# Patient Record
Sex: Female | Born: 1970
Health system: Southern US, Community
[De-identification: ages and names within clinical notes are randomized; demographics above are authoritative.]

## PROBLEM LIST (undated history)

## (undated) DIAGNOSIS — J302 Other seasonal allergic rhinitis: Secondary | ICD-10-CM

## (undated) DIAGNOSIS — J45909 Unspecified asthma, uncomplicated: Secondary | ICD-10-CM

## (undated) DIAGNOSIS — F419 Anxiety disorder, unspecified: Secondary | ICD-10-CM

## (undated) DIAGNOSIS — J3501 Chronic tonsillitis: Secondary | ICD-10-CM

## (undated) DIAGNOSIS — F32A Depression, unspecified: Secondary | ICD-10-CM

## (undated) DIAGNOSIS — T7840XA Allergy, unspecified, initial encounter: Secondary | ICD-10-CM

## (undated) DIAGNOSIS — F329 Major depressive disorder, single episode, unspecified: Secondary | ICD-10-CM

## (undated) HISTORY — PX: WISDOM TOOTH EXTRACTION: SHX21

## (undated) HISTORY — DX: Unspecified asthma, uncomplicated: J45.909

## (undated) HISTORY — PX: EYE SURGERY: SHX253

## (undated) HISTORY — PX: ENDOMETRIAL ABLATION: SHX621

## (undated) HISTORY — DX: Allergy, unspecified, initial encounter: T78.40XA

---

## 2014-07-21 ENCOUNTER — Ambulatory Visit: Payer: Self-pay

## 2015-02-01 ENCOUNTER — Ambulatory Visit: Payer: Self-pay | Admitting: Family Medicine

## 2016-08-14 ENCOUNTER — Encounter (HOSPITAL_BASED_OUTPATIENT_CLINIC_OR_DEPARTMENT_OTHER): Payer: Self-pay | Admitting: *Deleted

## 2016-08-14 NOTE — Anesthesia Preprocedure Evaluation (Addendum)
Anesthesia Evaluation  Patient identified by MRN, date of birth, ID band Patient awake    Reviewed: Allergy & Precautions, NPO status , Patient's Chart, lab work & pertinent test results  History of Anesthesia Complications (+) history of anesthetic complications  Airway Mallampati: II  TM Distance: >3 FB Neck ROM: Full    Dental no notable dental hx. (+) Teeth Intact, Dental Advisory Given   Pulmonary neg pulmonary ROS,    Pulmonary exam normal breath sounds clear to auscultation       Cardiovascular Exercise Tolerance: Good negative cardio ROS Normal cardiovascular exam Rhythm:Regular Rate:Normal     Neuro/Psych PSYCHIATRIC DISORDERS Anxiety Depression negative neurological ROS     GI/Hepatic negative GI ROS, Neg liver ROS,   Endo/Other  negative endocrine ROS  Renal/GU negative Renal ROS     Musculoskeletal negative musculoskeletal ROS (+)   Abdominal   Peds  Hematology negative hematology ROS (+)   Anesthesia Other Findings   Reproductive/Obstetrics negative OB ROS                          Anesthesia Physical Anesthesia Plan  ASA: II  Anesthesia Plan: General   Post-op Pain Management:    Induction: Intravenous  Airway Management Planned: Oral ETT  Additional Equipment:   Intra-op Plan:   Post-operative Plan: Extubation in OR  Informed Consent: I have reviewed the patients History and Physical, chart, labs and discussed the procedure including the risks, benefits and alternatives for the proposed anesthesia with the patient or authorized representative who has indicated his/her understanding and acceptance.   Dental advisory given  Plan Discussed with: CRNA and Anesthesiologist  Anesthesia Plan Comments:        Anesthesia Quick Evaluation

## 2016-08-15 ENCOUNTER — Ambulatory Visit (HOSPITAL_BASED_OUTPATIENT_CLINIC_OR_DEPARTMENT_OTHER)
Admission: RE | Admit: 2016-08-15 | Discharge: 2016-08-15 | Disposition: A | Discharge status: Home / Self Care | Payer: 59 | Source: Ambulatory Visit | Attending: Otolaryngology | Admitting: Otolaryngology

## 2016-08-15 ENCOUNTER — Ambulatory Visit (HOSPITAL_BASED_OUTPATIENT_CLINIC_OR_DEPARTMENT_OTHER): Payer: 59 | Admitting: Anesthesiology

## 2016-08-15 ENCOUNTER — Encounter (HOSPITAL_BASED_OUTPATIENT_CLINIC_OR_DEPARTMENT_OTHER)
Admission: RE | Disposition: A | Discharge status: Home / Self Care | Payer: Self-pay | Source: Ambulatory Visit | Attending: Otolaryngology

## 2016-08-15 ENCOUNTER — Encounter (HOSPITAL_BASED_OUTPATIENT_CLINIC_OR_DEPARTMENT_OTHER): Payer: Self-pay | Admitting: *Deleted

## 2016-08-15 DIAGNOSIS — F419 Anxiety disorder, unspecified: Secondary | ICD-10-CM | POA: Insufficient documentation

## 2016-08-15 DIAGNOSIS — J3501 Chronic tonsillitis: Secondary | ICD-10-CM | POA: Insufficient documentation

## 2016-08-15 DIAGNOSIS — F329 Major depressive disorder, single episode, unspecified: Secondary | ICD-10-CM | POA: Insufficient documentation

## 2016-08-15 HISTORY — DX: Chronic tonsillitis: J35.01

## 2016-08-15 HISTORY — DX: Major depressive disorder, single episode, unspecified: F32.9

## 2016-08-15 HISTORY — DX: Depression, unspecified: F32.A

## 2016-08-15 HISTORY — DX: Other seasonal allergic rhinitis: J30.2

## 2016-08-15 HISTORY — DX: Anxiety disorder, unspecified: F41.9

## 2016-08-15 HISTORY — PX: TONSILLECTOMY: SHX5217

## 2016-08-15 SURGERY — TONSILLECTOMY
Anesthesia: General | Site: Throat | Laterality: Bilateral

## 2016-08-15 MED ORDER — FENTANYL CITRATE (PF) 100 MCG/2ML IJ SOLN: 50.0000 ug | INTRAMUSCULAR | Status: DC | PRN

## 2016-08-15 MED ORDER — HYDROMORPHONE HCL 1 MG/ML IJ SOLN: 0.2500 mg | INTRAMUSCULAR | Status: DC | PRN

## 2016-08-15 MED ORDER — FENTANYL CITRATE (PF) 100 MCG/2ML IJ SOLN
INTRAMUSCULAR | Status: AC
  Filled 2016-08-15: qty 2

## 2016-08-15 MED ORDER — ONDANSETRON HCL 4 MG/2ML IJ SOLN
INTRAMUSCULAR | Status: DC | PRN
  Administered 2016-08-15: 4 mg via INTRAVENOUS

## 2016-08-15 MED ORDER — ONDANSETRON HCL 4 MG/2ML IJ SOLN
INTRAMUSCULAR | Status: AC
  Filled 2016-08-15: qty 2

## 2016-08-15 MED ORDER — HYDROMORPHONE HCL 1 MG/ML IJ SOLN
0.2500 mg | INTRAMUSCULAR | Status: DC | PRN
  Administered 2016-08-15: 0.5 mg via INTRAVENOUS

## 2016-08-15 MED ORDER — PROPOFOL 10 MG/ML IV BOLUS
INTRAVENOUS | Status: AC
  Filled 2016-08-15: qty 40

## 2016-08-15 MED ORDER — FENTANYL CITRATE (PF) 100 MCG/2ML IJ SOLN
INTRAMUSCULAR | Status: DC | PRN
  Administered 2016-08-15: 100 ug via INTRAVENOUS

## 2016-08-15 MED ORDER — AMOXICILLIN 250 MG/5ML PO SUSR
500.0000 mg | Freq: Two times a day (BID) | ORAL | 0 refills | Status: DC
Start: 2016-08-15 — End: 2017-07-04

## 2016-08-15 MED ORDER — SUGAMMADEX SODIUM 200 MG/2ML IV SOLN
INTRAVENOUS | Status: DC | PRN
  Administered 2016-08-15: 400 mg via INTRAVENOUS

## 2016-08-15 MED ORDER — PROPOFOL 10 MG/ML IV BOLUS
INTRAVENOUS | Status: DC | PRN
  Administered 2016-08-15: 150 mg via INTRAVENOUS

## 2016-08-15 MED ORDER — PROMETHAZINE HCL 25 MG/ML IJ SOLN: 6.2500 mg | INTRAMUSCULAR | Status: DC | PRN

## 2016-08-15 MED ORDER — MEPERIDINE HCL 25 MG/ML IJ SOLN: 6.2500 mg | INTRAMUSCULAR | Status: DC | PRN

## 2016-08-15 MED ORDER — CEFAZOLIN SODIUM-DEXTROSE 2-4 GM/100ML-% IV SOLN
INTRAVENOUS | Status: AC
  Filled 2016-08-15: qty 100

## 2016-08-15 MED ORDER — DEXAMETHASONE SODIUM PHOSPHATE 10 MG/ML IJ SOLN
INTRAMUSCULAR | Status: AC
  Filled 2016-08-15: qty 1

## 2016-08-15 MED ORDER — MIDAZOLAM HCL 2 MG/2ML IJ SOLN
INTRAMUSCULAR | Status: AC
  Filled 2016-08-15: qty 2

## 2016-08-15 MED ORDER — SCOPOLAMINE 1 MG/3DAYS TD PT72: 1.0000 | MEDICATED_PATCH | Freq: Once | TRANSDERMAL | Status: DC | PRN

## 2016-08-15 MED ORDER — LIDOCAINE 2% (20 MG/ML) 5 ML SYRINGE
INTRAMUSCULAR | Status: DC | PRN
  Administered 2016-08-15: 80 mg via INTRAVENOUS

## 2016-08-15 MED ORDER — DEXAMETHASONE SODIUM PHOSPHATE 4 MG/ML IJ SOLN
INTRAMUSCULAR | Status: DC | PRN
  Administered 2016-08-15: 10 mg via INTRAVENOUS

## 2016-08-15 MED ORDER — SUGAMMADEX SODIUM 200 MG/2ML IV SOLN
INTRAVENOUS | Status: AC
  Filled 2016-08-15: qty 2

## 2016-08-15 MED ORDER — MIDAZOLAM HCL 5 MG/5ML IJ SOLN
INTRAMUSCULAR | Status: DC | PRN
  Administered 2016-08-15: 2 mg via INTRAVENOUS

## 2016-08-15 MED ORDER — LACTATED RINGERS IV SOLN
INTRAVENOUS | Status: DC
  Administered 2016-08-15: 07:00:00 via INTRAVENOUS

## 2016-08-15 MED ORDER — HYDROMORPHONE HCL 1 MG/ML IJ SOLN
INTRAMUSCULAR | Status: AC
  Filled 2016-08-15: qty 1

## 2016-08-15 MED ORDER — ROCURONIUM BROMIDE 10 MG/ML (PF) SYRINGE
PREFILLED_SYRINGE | INTRAVENOUS | Status: DC | PRN
  Administered 2016-08-15: 40 mg via INTRAVENOUS

## 2016-08-15 MED ORDER — CEFAZOLIN SODIUM-DEXTROSE 2-3 GM-% IV SOLR
INTRAVENOUS | Status: DC | PRN
  Administered 2016-08-15: 2 g via INTRAVENOUS

## 2016-08-15 MED ORDER — ONDANSETRON HCL 4 MG/2ML IJ SOLN: 4.0000 mg | Freq: Once | INTRAMUSCULAR | Status: DC | PRN

## 2016-08-15 MED ORDER — OXYCODONE HCL 5 MG/5ML PO SOLN: 5.0000 mg | Freq: Once | ORAL | Status: DC | PRN

## 2016-08-15 MED ORDER — HYDROCODONE-ACETAMINOPHEN 7.5-325 MG/15ML PO SOLN: 10.0000 mL | Freq: Four times a day (QID) | ORAL | 0 refills | Status: DC | PRN

## 2016-08-15 MED ORDER — OXYCODONE HCL 5 MG PO TABS: 5.0000 mg | ORAL_TABLET | Freq: Once | ORAL | Status: DC | PRN

## 2016-08-15 MED ORDER — MIDAZOLAM HCL 2 MG/2ML IJ SOLN: 1.0000 mg | INTRAMUSCULAR | Status: DC | PRN

## 2016-08-15 SURGICAL SUPPLY — 30 items
BANDAGE COBAN STERILE 2 (GAUZE/BANDAGES/DRESSINGS) IMPLANT
CANISTER SUCT 1200ML W/VALVE (MISCELLANEOUS) ×3 IMPLANT
CATH ROBINSON RED A/P 12FR (CATHETERS) IMPLANT
COAGULATOR SUCT 6 FR SWTCH (ELECTROSURGICAL)
COAGULATOR SUCT SWTCH 10FR 6 (ELECTROSURGICAL) IMPLANT
COVER MAYO STAND STRL (DRAPES) ×3 IMPLANT
ELECT COATED BLADE 2.86 ST (ELECTRODE) ×3 IMPLANT
ELECT REM PT RETURN 9FT ADLT (ELECTROSURGICAL) ×3
ELECT REM PT RETURN 9FT PED (ELECTROSURGICAL)
ELECTRODE REM PT RETRN 9FT PED (ELECTROSURGICAL) IMPLANT
ELECTRODE REM PT RTRN 9FT ADLT (ELECTROSURGICAL) ×1 IMPLANT
GLOVE BIOGEL PI IND STRL 7.0 (GLOVE) ×1 IMPLANT
GLOVE BIOGEL PI INDICATOR 7.0 (GLOVE) ×2
GLOVE ECLIPSE 6.5 STRL STRAW (GLOVE) ×3 IMPLANT
GLOVE SS BIOGEL STRL SZ 7.5 (GLOVE) ×1 IMPLANT
GLOVE SUPERSENSE BIOGEL SZ 7.5 (GLOVE) ×2
GOWN STRL REUS W/ TWL LRG LVL3 (GOWN DISPOSABLE) ×2 IMPLANT
GOWN STRL REUS W/TWL LRG LVL3 (GOWN DISPOSABLE) ×4
MARKER SKIN DUAL TIP RULER LAB (MISCELLANEOUS) IMPLANT
NS IRRIG 1000ML POUR BTL (IV SOLUTION) ×3 IMPLANT
PENCIL FOOT CONTROL (ELECTRODE) ×3 IMPLANT
SHEET MEDIUM DRAPE 40X70 STRL (DRAPES) ×3 IMPLANT
SOLUTION BUTLER CLEAR DIP (MISCELLANEOUS) ×3 IMPLANT
SPONGE GAUZE 4X4 12PLY STER LF (GAUZE/BANDAGES/DRESSINGS) IMPLANT
SPONGE TONSIL 1 RF SGL (DISPOSABLE) IMPLANT
SPONGE TONSIL 1.25 RF SGL STRG (GAUZE/BANDAGES/DRESSINGS) IMPLANT
SYR BULB 3OZ (MISCELLANEOUS) ×3 IMPLANT
TOWEL OR 17X24 6PK STRL BLUE (TOWEL DISPOSABLE) ×3 IMPLANT
TUBE CONNECTING 20'X1/4 (TUBING) ×1
TUBE CONNECTING 20X1/4 (TUBING) ×2 IMPLANT

## 2016-08-15 NOTE — Op Note (Signed)
[name], [name]               ACCOUNT NO.:  [id number]  MEDICAL RECORD NO.:  [id number]  LOCATION:                                 FACILITY:  PHYSICIAN:  Cynthia Lawrence, M.D.DATE OF BIRTH:  Apr 18, 1971  DATE OF PROCEDURE:  08/15/2016 DATE OF DISCHARGE:                              OPERATIVE REPORT   PREOPERATIVE DIAGNOSIS:  Chronic tonsillitis.  POSTOPERATIVE DIAGNOSIS:  Chronic tonsillitis.  OPERATION PERFORMED:  Tonsillectomy.  SURGEON:  Cynthia GarbeChristopher E. Ezzard StandingNewman, MD.  ANESTHESIA:  General endotracheal.  COMPLICATIONS:  None.  BRIEF CLINICAL NOTE:  Cynthia Lawrence is a 45 year old female who has had chronic sore throats as well as frequent tonsilliths.  On exam, she has generous size 2 to 3+ size cryptic tonsils.  She is taken to the operating room this time for tonsillectomy.  DESCRIPTION OF PROCEDURE:  After adequate endotracheal anesthesia, the patient received 2 g of Ancef and 10 mg of Decadron IV preoperatively. A mouth gag was used to expose the oropharynx.  The left and right tonsils were then resected from tonsillar fossa using the cautery.  Care was taken to preserve the anterior and posterior tonsillar pillars as well as uvula.  Hemostasis was obtained with the cautery.  Following this, nasopharynx was examined.  She had no significant adenoid tissue. Oropharynx was irrigated with saline.  There was minimal bleeding.  This completed the procedure.  The patient was subsequently awoken from anesthesia and transferred to recovery room, postop doing well.  DISPOSITION:  Cynthia Lawrence was discharged home later this morning on amoxicillin suspension 500 mg b.i.d. for a week along with Tylenol, Motrin, or hydrocodone elixir 2 to 3 teaspoons q.4 hours p.r.n. pain. We will have her follow up in my office in 10 to 14 days for recheck.         ______________________________ Cynthia Garbehristopher E. Ezzard Lawrence, M.D.    CEN/MEDQ  D:  08/15/2016  T:  08/15/2016  Job:  [id number]  cc:    Cynthia Garbehristopher E. Ezzard Lawrence, M.D.

## 2016-08-15 NOTE — Interval H&P Note (Signed)
History and Physical Interval Note:  08/15/2016 7:25 AM  Cynthia Lawrence  has presented today for surgery, with the diagnosis of CHRONIC TONSILLITIS  The various methods of treatment have been discussed with the patient and family. After consideration of risks, benefits and other options for treatment, the patient has consented to  Procedure(s): TONSILLECTOMY (Bilateral) as a surgical intervention .  The patient's history has been reviewed, patient examined, no change in status, stable for surgery.  I have reviewed the patient's chart and labs.  Questions were answered to the patient's satisfaction.     [name]

## 2016-08-15 NOTE — Discharge Instructions (Addendum)
Instructions for Home Care After Tonsillectomy  First Day Home: Encourage fluid intake by frequently offering liquids, soup, ice cream jello, etc.  Drink several glasses of water.  Cooler fluids are best.  Avoid hot and highly seasoned foods.  Orange juice, grapefruit juice and tomato juice may cause stinging sensation because of their acidic content.    Second and Third Day Home: Continue liquids and add soft foods, (pudding, macaroni and cheese, mashed potatoes, soft scrambled eggs, etc.).  Make sure you drink plenty of liquids so you do not get dehydrated.  Fifth Thru Seventh Day Home: Gradually resume a normal diet, but avoid hot foods, potato chips, nuts, toast and crackers until 2 weeks after surgery.  General Instructions   No undue physical exertion or exercise for one week.  Children: Tylenol may be used for discomfort and/or fever.  Use as often as necessary within limits of the directions.  Adults: May spray throat with Chloroseptic or other topical anesthetic for discomfort and use pain medication obtained by prescription as directed.    A slight fever (up to 101) is expected for the first the first couple of days.  Take Tylenol (or aspirin substitute) as directed.  Pain in ears is common after tonsillectomy.  It represents pain referred from the throat where the tonsils were removed.  There is usually nothing wrong with the ears in most cases.  Administer Tylenol as needed to control this pain.  White patches will form where the tonsils were removed.  This is perfectly normal.  They will disappear in one to two weeks.  Mouth odor may be notice during the healing stages.  Do not use aspirin for two weeks; it increases the possibility of bleeding.  In a very small percentage of people, there is some bleeding after five to six days.  If this happens, do not become excited, for the bleeding is usually light.  Be quiet, lie down, and spit the blood out gently.  Gargle the throat  with ice water.  If the bleeding does not stop promptly, call the office [phone]), which answers 24 hours a day.  A follow up appointment should be made with Dr. Ezzard StandingNewman 10-14 days following surgery. Please call [phone] for the appointment time.    Take your regular meds Tylenol, motrin, ibuprofen or hydrocodone elixir 10-15 cc every 6 hrs prn pain Amoxicillin 500 mg twice per day for the next week    Post Anesthesia Home Care Instructions  Activity: Get plenty of rest for the remainder of the day. A responsible adult should stay with you for 24 hours following the procedure.  For the next 24 hours, DO NOT: -Drive a car -Advertising copywriterperate machinery -Drink alcoholic beverages -Take any medication unless instructed by your physician -Make any legal decisions or sign important papers.  Meals: Start with liquid foods such as gelatin or soup. Progress to regular foods as tolerated. Avoid greasy, spicy, heavy foods. If nausea and/or vomiting occur, drink only clear liquids until the nausea and/or vomiting subsides. Call your physician if vomiting continues.  Special Instructions/Symptoms: Your throat may feel dry or sore from the anesthesia or the breathing tube placed in your throat during surgery. If this causes discomfort, gargle with warm salt water. The discomfort should disappear within 24 hours.  If you had a scopolamine patch placed behind your ear for the management of post- operative nausea and/or vomiting:  1. The medication in the patch is effective for 72 hours, after which it should be removed.  Wrap patch in a tissue and discard in the trash. Wash hands thoroughly with soap and water. 2. You may remove the patch earlier than 72 hours if you experience unpleasant side effects which may include dry mouth, dizziness or visual disturbances. 3. Avoid touching the patch. Wash your hands with soap and water after contact with the patch.

## 2016-08-15 NOTE — Anesthesia Postprocedure Evaluation (Signed)
Anesthesia Post Note  Patient: Cynthia Lawrence  Procedure(s) Performed: Procedure(s) (LRB): TONSILLECTOMY (Bilateral)  Patient location during evaluation: PACU Anesthesia Type: General Level of consciousness: awake and alert Pain management: pain level controlled Vital Signs Assessment: post-procedure vital signs reviewed and stable Respiratory status: spontaneous breathing, nonlabored ventilation, respiratory function stable and patient connected to nasal cannula oxygen Cardiovascular status: blood pressure returned to baseline and stable Postop Assessment: no signs of nausea or vomiting Anesthetic complications: no    Last Vitals:  Vitals:   08/15/16 0845 08/15/16 0918  BP: (!) 133/93 (!) 151/90  Pulse: 94 91  Resp: 14 18  Temp:  37 C    Last Pain:  Vitals:   08/15/16 0918  TempSrc:   PainSc: 2                  Sheala Dosh DAVID

## 2016-08-15 NOTE — Transfer of Care (Signed)
Immediate Anesthesia Transfer of Care Note  Patient: Cynthia Lawrence  Procedure(s) Performed: Procedure(s): TONSILLECTOMY (Bilateral)  Patient Location: PACU  Anesthesia Type:General  Level of Consciousness: awake, alert , oriented and patient cooperative  Airway & Oxygen Therapy: Patient Spontanous Breathing and Patient connected to face mask oxygen  Post-op Assessment: Report given to RN and Post -op Vital signs reviewed and stable  Post vital signs: Reviewed and stable  Last Vitals:  Vitals:   08/15/16 0649  BP: (!) 115/101  Pulse: (!) 118  Resp: 20  Temp: 36.7 C    Last Pain:  Vitals:   08/15/16 0649  TempSrc: Oral         Complications: No apparent anesthesia complications

## 2016-08-15 NOTE — Brief Op Note (Signed)
08/15/2016  7:58 AM  PATIENT:  Okey RegalAngela Bardon  45 y.o. female  PRE-OPERATIVE DIAGNOSIS:  CHRONIC TONSILLITIS  POST-OPERATIVE DIAGNOSIS:  CHRONIC TONSILLITIS  PROCEDURE:  Procedure(s): TONSILLECTOMY (Bilateral)  SURGEON:  Surgeon(s) and Role:    * Drema Halonhristopher E Alianis Trimmer, MD - Primary  PHYSICIAN ASSISTANT:   ASSISTANTS: none   ANESTHESIA:   general  EBL:  Total I/O In: -  Out: 5 [Blood:5]  BLOOD ADMINISTERED:none  DRAINS: none   LOCAL MEDICATIONS USED:  NONE  SPECIMEN:  No Specimen  DISPOSITION OF SPECIMEN:  N/A  COUNTS:  YES  TOURNIQUET:  * No tourniquets in log *  DICTATION: .Other Dictation: Dictation Number [id number]  PLAN OF CARE: Discharge to home after PACU  PATIENT DISPOSITION:  PACU - hemodynamically stable.   Delay start of Pharmacological VTE agent (>24hrs) due to surgical blood loss or risk of bleeding: not applicable

## 2016-08-15 NOTE — H&P (Signed)
PREOPERATIVE H&P  Chief Complaint: chronic sore throats  HPI: Cynthia Lawrence is a 45 y.o. female who presents for evaluation of chronic sore throats and tonsil problems. She's had frequent tonsil stones and sore throats. On Exam she has large cryptic tonsils. She's taken to the OR for tonsillectomy.  Past Medical History:  Diagnosis Date  . Anxiety   . Chronic tonsillitis   . Depression   . Seasonal allergies    Past Surgical History:  Procedure Laterality Date  . ENDOMETRIAL ABLATION    . EYE SURGERY     lasik  . WISDOM TOOTH EXTRACTION     Social History   Social History  . Marital status: Married    Spouse name: N/A  . Number of children: N/A  . Years of education: N/A   Social History Main Topics  . Smoking status: Never Smoker  . Smokeless tobacco: Never Used  . Alcohol use No  . Drug use: No  . Sexual activity: Not Asked     Comment: ablation   Other Topics Concern  . None   Social History Narrative  . None   History reviewed. No pertinent family history. No Known Allergies Prior to Admission medications   Medication Sig Start Date End Date Taking? Authorizing Provider  buPROPion (WELLBUTRIN SR) 200 MG 12 hr tablet Take 200 mg by mouth 2 (two) times daily.   Yes Historical Provider, MD  cetirizine (ZYRTEC) 10 MG tablet Take 10 mg by mouth daily.   Yes Historical Provider, MD  cholecalciferol (VITAMIN D) 400 units TABS tablet Take 400 Units by mouth.   Yes Historical Provider, MD  diazepam (VALIUM) 2 MG tablet Take 2 mg by mouth every 6 (six) hours as needed for anxiety.   Yes Historical Provider, MD  fluticasone (FLONASE) 50 MCG/ACT nasal spray Place into both nostrils daily.   Yes Historical Provider, MD  Nutritional Supplements (JUICE PLUS FIBRE PO) Take by mouth.   Yes Historical Provider, MD  vitamin B-12 (CYANOCOBALAMIN) 100 MCG tablet Take 100 mcg by mouth daily.   Yes Historical Provider, MD     Positive ROS: frequent sore throats  All other  systems have been reviewed and were otherwise negative with the exception of those mentioned in the HPI and as above.  Physical Exam: Vitals:   08/15/16 0649  BP: (!) 115/101  Pulse: (!) 118  Resp: 20  Temp: 98.1 F (36.7 C)    General: Alert, no acute distress Oral: Normal oral mucosa and 3+ tonsils Nasal: Clear nasal passages Neck: No palpable adenopathy or thyroid nodules Ear: Ear canal is clear with normal appearing TMs Cardiovascular: Regular rate and rhythm, no murmur.  Respiratory: Clear to auscultation Neurologic: Alert and oriented x 3   Assessment/Plan: CHRONIC TONSILLITIS Plan for Procedure(s): TONSILLECTOMY   Dillard CannonHRISTOPHER NEWMAN, MD 08/15/2016 7:22 AM

## 2016-08-15 NOTE — Anesthesia Procedure Notes (Signed)
Procedure Name: Intubation Date/Time: 08/15/2016 7:37 AM Performed by: Tyrone NineSAUVE, Charon Smedberg F Pre-anesthesia Checklist: Patient identified, Timeout performed, Patient being monitored, Suction available and Emergency Drugs available Patient Re-evaluated:Patient Re-evaluated prior to inductionOxygen Delivery Method: Circle system utilized Preoxygenation: Pre-oxygenation with 100% oxygen Intubation Type: IV induction Ventilation: Mask ventilation without difficulty Laryngoscope Size: Mac and 3 Grade View: Grade I Tube type: Oral Tube size: 7.0 mm Number of attempts: 1 Airway Equipment and Method: Stylet

## 2016-08-16 ENCOUNTER — Encounter (HOSPITAL_BASED_OUTPATIENT_CLINIC_OR_DEPARTMENT_OTHER): Payer: Self-pay | Admitting: Otolaryngology

## 2016-09-26 ENCOUNTER — Other Ambulatory Visit: Payer: Self-pay | Admitting: Allergy

## 2016-09-26 ENCOUNTER — Ambulatory Visit
Admission: RE | Admit: 2016-09-26 | Discharge: 2016-09-26 | Disposition: A | Discharge status: Home / Self Care | Payer: 59 | Source: Ambulatory Visit | Attending: Allergy | Admitting: Allergy

## 2016-09-26 DIAGNOSIS — R05 Cough: Secondary | ICD-10-CM

## 2016-09-26 DIAGNOSIS — R059 Cough, unspecified: Secondary | ICD-10-CM

## 2017-07-04 ENCOUNTER — Ambulatory Visit (INDEPENDENT_AMBULATORY_CARE_PROVIDER_SITE_OTHER): Payer: 59 | Admitting: Adult Health

## 2017-07-04 ENCOUNTER — Encounter: Payer: Self-pay | Admitting: Adult Health

## 2017-07-04 VITALS — BP 113/78 | HR 90 | Ht 62.75 in | Wt 199.0 lb

## 2017-07-04 DIAGNOSIS — F339 Major depressive disorder, recurrent, unspecified: Secondary | ICD-10-CM

## 2017-07-04 DIAGNOSIS — Z0000 Encounter for general adult medical examination without abnormal findings: Secondary | ICD-10-CM | POA: Diagnosis not present
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
## 2017-07-04 NOTE — Assessment & Plan Note (Signed)
  Increase water intake, strive for at least 100 ounces/day.   Follow Heart Healthy diet Increase regular exercise.  Recommend at least 30 minutes daily, 5 days per week of walking, jogging, biking, swimming, YouTube/Pinterest workout videos. Carollee Herter [place]/[facility]- will need to see primary care provider in [place] to obtain referral. Please schedule fasting labs/full physical in the next few months.

## 2017-07-04 NOTE — Assessment & Plan Note (Signed)
Last contact with psychiatrist and dose of Wellbutrin was June 2018. She is not interested in restarting either today. Discussed at length her sx's and she vehemently denies thoughts of harming herself/others. Continue Doterra Essential Oils. Increase regular exercise.

## 2017-07-04 NOTE — Patient Instructions (Addendum)
Fat and Cholesterol Restricted Diet High levels of fat and cholesterol in your blood may lead to various health problems, such as diseases of the heart, blood vessels, gallbladder, liver, and pancreas. Fats are concentrated sources of energy that come in various forms. Certain types of fat, including saturated fat, may be harmful in excess. Cholesterol is a substance needed by your body in small amounts. Your body makes all the cholesterol it needs. Excess cholesterol comes from the food you eat. When you have high levels of cholesterol and saturated fat in your blood, health problems can develop because the excess fat and cholesterol will gather along the walls of your blood vessels, causing them to narrow. Choosing the right foods will help you control your intake of fat and cholesterol. This will help keep the levels of these substances in your blood within normal limits and reduce your risk of disease. What is my plan? Your health care provider recommends that you:  Limit your fat intake to _25_% or less of your total calories per day.  Limit the amount of cholesterol in your diet to less than _300_mg per day.  Eat 20-30 grams of fiber each day.  What types of fat should I choose?  Choose healthy fats more often. Choose monounsaturated and polyunsaturated fats, such as olive and canola oil, flaxseeds, walnuts, almonds, and seeds.  Eat more omega-3 fats. Good choices include salmon, mackerel, sardines, tuna, flaxseed oil, and ground flaxseeds. Aim to eat fish at least two times a week.  Limit saturated fats. Saturated fats are primarily found in animal products, such as meats, butter, and cream. Plant sources of saturated fats include palm oil, palm kernel oil, and coconut oil.  Avoid foods with partially hydrogenated oils in them. These contain trans fats. Examples of foods that contain trans fats are stick margarine, some tub margarines, cookies, crackers, and other baked goods. What  general guidelines do I need to follow? These guidelines for healthy eating will help you control your intake of fat and cholesterol:  Check food labels carefully to identify foods with trans fats or high amounts of saturated fat.  Fill one half of your plate with vegetables and green salads.  Fill one fourth of your plate with whole grains. Look for the word "whole" as the first word in the ingredient list.  Fill one fourth of your plate with lean protein foods.  Limit fruit to two servings a day. Choose fruit instead of juice.  Eat more foods that contain fiber, such as apples, broccoli, carrots, beans, peas, and barley.  Eat more home-cooked food and less restaurant, buffet, and fast food.  Limit or avoid alcohol.  Limit foods high in starch and sugar.  Limit fried foods.  Cook foods using methods other than frying. Baking, boiling, grilling, and broiling are all great options.  Lose weight if you are overweight. Losing just 5-10% of your initial body weight can help your overall health and prevent diseases such as diabetes and heart disease.  What foods can I eat? Grains  Whole grains, such as whole wheat or whole grain breads, crackers, cereals, and pasta. Unsweetened oatmeal, bulgur, barley, quinoa, or brown rice. Corn or whole wheat flour tortillas. Vegetables  Fresh or frozen vegetables (raw, steamed, roasted, or grilled). Green salads. Fruits  All fresh, canned (in natural juice), or frozen fruits. Meats and other protein foods  Ground beef (85% or leaner), grass-fed beef, or beef trimmed of fat. Skinless chicken or [place]. Ground chicken or [place].  Pork trimmed of fat. All fish and seafood. Eggs. Dried beans, peas, or lentils. Unsalted nuts or seeds. Unsalted canned or dry beans. Dairy  Low-fat dairy products, such as skim or 1% milk, 2% or reduced-fat cheeses, low-fat ricotta or cottage cheese, or plain low-fat yo Fats and oils  Tub margarines without trans  fats. Light or reduced-fat mayonnaise and salad dressings. Avocado. Olive, canola, sesame, or safflower oils. Natural peanut or almond butter (choose ones without added sugar and oil). The items listed above may not be a complete list of recommended foods or beverages. Contact your dietitian for more options. Foods to avoid Grains  White bread. White pasta. White rice. Cornbread. Bagels, pastries, and croissants. Crackers that contain trans fat. Vegetables  White potatoes. Corn. Creamed or fried vegetables. Vegetables in a cheese sauce. Fruits  Dried fruits. Canned fruit in light or heavy syrup. Fruit juice. Meats and other protein foods  Fatty cuts of meat. Ribs, chicken wings, bacon, sausage, bologna, salami, chitterlings, fatback, hot dogs, bratwurst, and packaged luncheon meats. Liver and organ meats. Dairy  Whole or 2% milk, cream, half-and-half, and cream cheese. Whole milk cheeses. Whole-fat or sweetened yogurt. Full-fat cheeses. Nondairy creamers and whipped toppings. Processed cheese, cheese spreads, or cheese curds. Beverages  Alcohol. Sweetened drinks (such as sodas, lemonade, and fruit drinks or punches). Fats and oils  Butter, stick margarine, lard, shortening, ghee, or bacon fat. Coconut, palm kernel, or palm oils. Sweets and desserts  Corn syrup, sugars, honey, and molasses. Candy. Jam and jelly. Syrup. Sweetened cereals. Cookies, pies, cakes, donuts, muffins, and ice cream. The items listed above may not be a complete list of foods and beverages to avoid. Contact your dietitian for more information. This information is not intended to replace advice given to you by your health care provider. Make sure you discuss any questions you have with your health care provider. Document Released: 09/11/2005 Document Revised: 10/02/2014 Document Reviewed: 12/10/2013 Elsevier Interactive Patient Education  2017 [employer] Inc.   Increase water intake, strive for at least 100  ounces/day.   Follow Heart Healthy diet Increase regular exercise.  Recommend at least 30 minutes daily, 5 days per week of walking, jogging, biking, swimming, YouTube/Pinterest workout videos. [facility] [place]- will need to see primary care provider in [place] to obtain referral. Please schedule fasting labs/full physical in the next few months. WELCOME TO THE PRACTICE!

## 2017-07-16 ENCOUNTER — Other Ambulatory Visit: Payer: Self-pay

## 2017-07-16 DIAGNOSIS — Z0000 Encounter for general adult medical examination without abnormal findings: Secondary | ICD-10-CM

## 2017-07-18 ENCOUNTER — Other Ambulatory Visit: Payer: 59

## 2017-07-23 ENCOUNTER — Encounter: Payer: 59 | Admitting: Adult Health

## 2017-12-28 ENCOUNTER — Other Ambulatory Visit: Payer: Self-pay | Admitting: Adult Health

## 2017-12-28 ENCOUNTER — Encounter: Payer: Self-pay | Admitting: Adult Health

## 2017-12-28 ENCOUNTER — Ambulatory Visit (INDEPENDENT_AMBULATORY_CARE_PROVIDER_SITE_OTHER): Payer: 59 | Admitting: Adult Health

## 2017-12-28 VITALS — BP 118/81 | HR 79 | Temp 98.2°F | Ht 62.75 in | Wt 192.5 lb

## 2017-12-28 DIAGNOSIS — Z0000 Encounter for general adult medical examination without abnormal findings: Secondary | ICD-10-CM

## 2017-12-28 DIAGNOSIS — R300 Dysuria: Secondary | ICD-10-CM

## 2017-12-28 DIAGNOSIS — R8290 Unspecified abnormal findings in urine: Secondary | ICD-10-CM | POA: Insufficient documentation

## 2017-12-28 LAB — POCT URINALYSIS DIPSTICK
Bilirubin, UA: NEGATIVE
Glucose, UA: NEGATIVE
Ketones, UA: NEGATIVE
NITRITE UA: NEGATIVE
PH UA: 6 (ref 5.0–8.0)
PROTEIN UA: NEGATIVE
Spec Grav, UA: <=1.005 — AB (ref 1.010–1.025)
Urobilinogen, UA: 0.2 E.U./dL

## 2017-12-28 NOTE — Patient Instructions (Addendum)
Urinary Tract Infection, Adult A urinary tract infection (UTI) is an infection of any part of the urinary tract. The urinary tract includes the:  Kidneys.  Ureters.  Bladder.  Urethra.  These organs make, store, and get rid of pee (urine) in the body. Follow these instructions at home:  Take over-the-counter and prescription medicines only as told by your doctor.  If you were prescribed an antibiotic medicine, take it as told by your doctor. Do not stop taking the antibiotic even if you start to feel better.  Avoid the following drinks: ? Alcohol. ? Caffeine. ? Tea. ? Carbonated drinks.  Drink enough fluid to keep your pee clear or pale yellow.  Keep all follow-up visits as told by your doctor. This is important.  Make sure to: ? Empty your bladder often and completely. Do not to hold pee for long periods of time. ? Empty your bladder before and after sex. ? Wipe from front to back after a bowel movement if you are female. Use each tissue one time when you wipe. Contact a doctor if:  You have back pain.  You have a fever.  You feel sick to your stomach (nauseous).  You throw up (vomit).  Your symptoms do not get better after 3 days.  Your symptoms go away and then come back. Get help right away if:  You have very bad back pain.  You have very bad lower belly (abdominal) pain.  You are throwing up and cannot keep down any medicines or water. This information is not intended to replace advice given to you by your health care provider. Make sure you discuss any questions you have with your health care provider. Document Released: 02/28/2008 Document Revised: 02/17/2016 Document Reviewed: 08/02/2015 Elsevier Interactive Patient Education  2018 [employer]  Urinalysis- positive for blood and leukocytes. Specimen sent for culture/sensitivty- we will call you when results are available. Please take Macrodantin as directed. Continue excellent water  intake. Alternate OTC Acetaminophen and Ibuprofen for discomfort. HAVE A GREAT TIME ON YOUR SECOND HONEYMOON! FEEL BETTER!

## 2017-12-28 NOTE — Assessment & Plan Note (Signed)
Urinalysis- positive for blood and leukocytes. Specimen sent for culture/sensitivty- we will call you when results are available. Please take Macrodantin as directed. Continue excellent water intake. Alternate OTC Acetaminophen and Ibuprofen for discomfort.

## 2017-12-28 NOTE — Progress Notes (Addendum)
Subjective:    Patient ID: Cynthia Lawrence, female    DOB: October 04, 1970, 47 y.o.   MRN: [id number]  HPI:  Cynthia Lawrence presents with dysuria/frequency/lower abdominal pain/pressure, flank pain, and hematuria that all started early this morning. Abdominal pain is constant, dull ache rated 3/10. Bil flank pain is constant, dull aches rated 5/10 She denies fever/night sweats. She denies/CP/dyspnea/dizzinezz/palpitations/HA She denies abdominal pain/hematochezia/N/V/D She was given Azithromycin Monday to treat bronchitis, will complete 5 days course today.  Patient Care Team    Relationship Specialty Notifications Start End  William Hamburger D, NP PCP - General Family Medicine  07/04/17   Silverio Lay, MD Consulting Physician Obstetrics and Gynecology  07/05/17   Drema Halon, MD Consulting Physician Otolaryngology  07/05/17     Patient Active Problem List   Diagnosis Date Noted  . Abnormal urinalysis 12/28/2017  . Dysuria 12/28/2017  . Healthcare maintenance 07/04/2017  . Depression, recurrent (HCC) 07/04/2017     Past Medical History:  Diagnosis Date  . Allergy   . Anxiety   . Asthma   . Chronic tonsillitis   . Depression   . Seasonal allergies      Past Surgical History:  Procedure Laterality Date  . ENDOMETRIAL ABLATION    . EYE SURGERY     lasik  . TONSILLECTOMY Bilateral 08/15/2016   Procedure: TONSILLECTOMY;  Surgeon: Drema Halon, MD;  Location: [facility];  Service: ENT;  Laterality: Bilateral;  . WISDOM TOOTH EXTRACTION       Family History  Problem Relation Age of Onset  . Diabetes Mother   . Stroke Mother   . Diabetes Father   . Healthy Sister   . Healthy Brother   . Healthy Daughter   . Healthy Son   . Healthy Sister   . Healthy Daughter   . Healthy Daughter      Social History   Substance and Sexual Activity  Drug Use No     Social History   Substance and Sexual Activity  Alcohol Use No     Social  History   Tobacco Use  Smoking Status Never Smoker  Smokeless Tobacco Never Used     Outpatient Encounter Medications as of 12/28/2017  Medication Sig  . azithromycin (ZITHROMAX) 250 MG tablet Take 1 tablet by mouth daily.  . cetirizine (ZYRTEC) 10 MG tablet Take 10 mg by mouth daily.  . cholecalciferol (VITAMIN D) 400 units TABS tablet Take 400 Units by mouth.  . citalopram (CELEXA) 20 MG tablet Take 1.5 tablets by mouth daily.  . diazepam (VALIUM) 2 MG tablet Take 1 mg by mouth at bedtime.  . fluticasone (FLONASE) 50 MCG/ACT nasal spray Place into both nostrils daily.  . montelukast (SINGULAIR) 10 MG tablet Take 1 tablet by mouth daily.  . prazosin (MINIPRESS) 1 MG capsule Take 3 mg by mouth at bedtime.  . vitamin B-12 (CYANOCOBALAMIN) 100 MCG tablet Take 100 mcg by mouth daily.   No facility-administered encounter medications on file as of 12/28/2017.     Allergies: Patient has no known allergies.  Body mass index is 34.37 kg/m.  Blood pressure 118/81, pulse 79, temperature 98.2 F (36.8 C), temperature source Oral, height 5' 2.75" (1.594 m), weight 192 lb 8 oz (87.3 kg), SpO2 97 %.     Review of Systems  Constitutional: Positive for fatigue. Negative for activity change, appetite change, chills, diaphoresis, fever and unexpected weight change.  HENT: Negative for congestion.   Eyes: Negative for visual  disturbance.  Respiratory: Negative for cough, chest tightness, shortness of breath, wheezing and stridor.   Cardiovascular: Negative for chest pain, palpitations and leg swelling.  Gastrointestinal: Positive for abdominal pain. Negative for abdominal distention, blood in stool, constipation, diarrhea, nausea and vomiting.  Genitourinary: Positive for dysuria, flank pain, frequency, hematuria and urgency. Negative for difficulty urinating, genital sores, menstrual problem, pelvic pain and vaginal discharge.  Neurological: Negative for dizziness and headaches.   Psychiatric/Behavioral: Positive for sleep disturbance.       Objective:   Physical Exam  Constitutional: She is oriented to person, place, and time. She appears well-developed and well-nourished. No distress.  HENT:  Head: Normocephalic and atraumatic.  Right Ear: External ear normal.  Left Ear: External ear normal.  Cardiovascular: Normal rate, regular rhythm, normal heart sounds and intact distal pulses.  No murmur heard. Pulmonary/Chest: Effort normal and breath sounds normal. No respiratory distress. She has no wheezes. She has no rales. She exhibits no tenderness.  Abdominal: Soft. Bowel sounds are normal. She exhibits no distension and no mass. There is tenderness in the right lower quadrant and left lower quadrant. There is CVA tenderness. There is no rebound and no guarding.  Neurological: She is alert and oriented to person, place, and time.  Skin: Skin is warm. No rash noted. She is not diaphoretic. No erythema. No pallor.  Psychiatric: She has a normal mood and affect. Her behavior is normal. Judgment and thought content normal.  Nursing note and vitals reviewed.         Assessment & Plan:   1. Dysuria   2. Abnormal urinalysis     Abnormal urinalysis Urinalysis- positive for blood and leukocytes. Specimen sent for culture/sensitivty- we will call you when results are available. Please take Macrodantin as directed. Continue excellent water intake. Alternate OTC Acetaminophen and Ibuprofen for discomfort.    FOLLOW-UP:  Return if symptoms worsen or fail to improve.

## 2017-12-31 LAB — CULTURE, URINE COMPREHENSIVE

## 2018-05-02 ENCOUNTER — Encounter: Payer: Self-pay | Admitting: Adult Health

## 2018-05-02 ENCOUNTER — Ambulatory Visit: Payer: 59 | Admitting: Adult Health

## 2018-05-02 VITALS — BP 128/82 | HR 117 | Temp 98.3°F | Ht 62.75 in | Wt 193.9 lb

## 2018-05-02 DIAGNOSIS — R21 Rash and other nonspecific skin eruption: Secondary | ICD-10-CM

## 2018-05-02 DIAGNOSIS — W57XXXA Bitten or stung by nonvenomous insect and other nonvenomous arthropods, initial encounter: Secondary | ICD-10-CM | POA: Diagnosis not present

## 2018-05-02 MED ORDER — METHYLPREDNISOLONE ACETATE 40 MG/ML IJ SUSP
40.0000 mg | Freq: Once | INTRAMUSCULAR | Status: AC
  Administered 2018-05-02: 40 mg via INTRAMUSCULAR

## 2018-05-02 MED ORDER — PREDNISONE 20 MG PO TABS: ORAL_TABLET | ORAL | 0 refills | Status: DC

## 2018-05-02 NOTE — Assessment & Plan Note (Signed)
Methyiprednisone acetate 40mg  given in clinic- observed for reactions- none noted Start Prednisone Taper- TOMORROW 05/03/2018 Increase fluids Recommend OTC Claritin in am and OTC Benadryl in evening. If you develop fever, night sweats, GI upset, rash, or joint pain- please call clinic.

## 2018-05-02 NOTE — Progress Notes (Addendum)
Subjective:    Patient ID: Cynthia Lawrence, female    DOB: 11/11/1970, 47 y.o.   MRN: [id number]  HPI:  [name] presents with diffuse ithcing r/t "dust tick" bites she sustained 2 days ago while walking [place] acre property [place]. She noticed ticks "were all over my legs" immediately after leaving property.  She showered as soon as she could and has been "ithcing like crazy ever since" She denies fever/night sweats/N/V/D/myalgia's/ joint pain She denies hx of lyme diease  Patient Care Team    Relationship Specialty Notifications Start End  Avonda Toso, Jinny BlossomKaty D, NP PCP - General Family Medicine  07/04/17   Silverio Layivard, Sandra, MD Consulting Physician Obstetrics and Gynecology  07/05/17   Drema HalonNewman, Christopher E, MD Consulting Physician Otolaryngology  07/05/17     Patient Active Problem List   Diagnosis Date Noted  . Tick bite 05/02/2018  . Abnormal urinalysis 12/28/2017  . Dysuria 12/28/2017  . Healthcare maintenance 07/04/2017  . Depression, recurrent (HCC) 07/04/2017     Past Medical History:  Diagnosis Date  . Allergy   . Anxiety   . Asthma   . Chronic tonsillitis   . Depression   . Seasonal allergies      Past Surgical History:  Procedure Laterality Date  . ENDOMETRIAL ABLATION    . EYE SURGERY     lasik  . TONSILLECTOMY Bilateral 08/15/2016   Procedure: TONSILLECTOMY;  Surgeon: Drema Halonhristopher E Newman, MD;  Location: [facility];  Service: ENT;  Laterality: Bilateral;  . WISDOM TOOTH EXTRACTION       Family History  Problem Relation Age of Onset  . Diabetes Mother   . Stroke Mother   . Diabetes Father   . Healthy Sister   . Healthy Brother   . Healthy Daughter   . Healthy Son   . Healthy Sister   . Healthy Daughter   . Healthy Daughter      Social History   Substance and Sexual Activity  Drug Use No     Social History   Substance and Sexual Activity  Alcohol Use No     Social History   Tobacco Use  Smoking Status Never  Smoker  Smokeless Tobacco Never Used     Outpatient Encounter Medications as of 05/02/2018  Medication Sig  . cetirizine (ZYRTEC) 10 MG tablet Take 10 mg by mouth daily.  . cholecalciferol (VITAMIN D) 400 units TABS tablet Take 400 Units by mouth.  . citalopram (CELEXA) 20 MG tablet Take 1.5 tablets by mouth daily.  . diazepam (VALIUM) 2 MG tablet Take 1 mg by mouth at bedtime.  . fluticasone (FLONASE) 50 MCG/ACT nasal spray Place into both nostrils daily.  . montelukast (SINGULAIR) 10 MG tablet Take 1 tablet by mouth daily.  . prazosin (MINIPRESS) 1 MG capsule Take 3 mg by mouth at bedtime.  . vitamin B-12 (CYANOCOBALAMIN) 100 MCG tablet Take 100 mcg by mouth daily.  . predniSONE (DELTASONE) 20 MG tablet 1 tablet every 12 hrs for three days then one tablet daily for three days  . [DISCONTINUED] azithromycin (ZITHROMAX) 250 MG tablet Take 1 tablet by mouth daily.  . [EXPIRED] methylPREDNISolone acetate (DEPO-MEDROL) injection 40 mg    No facility-administered encounter medications on file as of 05/02/2018.     Allergies: Patient has no known allergies.  Body mass index is 34.62 kg/m.  Blood pressure 128/82, pulse (!) 117, temperature 98.3 F (36.8 C), temperature source Oral, height 5' 2.75" (1.594 m), weight 193  lb 14.4 oz (88 kg), SpO2 96 %.  Review of Systems  Constitutional: Positive for fatigue. Negative for activity change, appetite change, chills, diaphoresis, fever and unexpected weight change.  HENT: Negative for congestion.   Eyes: Negative for visual disturbance.  Respiratory: Negative for cough, chest tightness, shortness of breath, wheezing and stridor.   Cardiovascular: Negative for chest pain, palpitations and leg swelling.  Gastrointestinal: Negative for abdominal distention, abdominal pain, blood in stool, constipation, diarrhea, nausea and vomiting.  Genitourinary: Negative for difficulty urinating and flank pain.  Musculoskeletal: Negative for arthralgias, back  pain, gait problem, joint swelling, myalgias, neck pain and neck stiffness.  Skin: Positive for color change and rash. Negative for pallor and wound.  Neurological: Negative for dizziness and headaches.  Psychiatric/Behavioral: Positive for sleep disturbance.       Objective:   Physical Exam  Constitutional: She is oriented to person, place, and time. She appears well-developed and well-nourished. No distress.  HENT:  Head: Normocephalic and atraumatic.  Right Ear: External ear normal.  Left Ear: External ear normal.  Nose: Nose normal.  Mouth/Throat: Oropharynx is clear and moist.  Eyes: Pupils are equal, round, and reactive to light. Conjunctivae and EOM are normal.  Cardiovascular: Normal rate, regular rhythm, normal heart sounds and intact distal pulses.  No murmur heard. Pulmonary/Chest: Effort normal and breath sounds normal. No stridor. No respiratory distress. She has no wheezes. She has no rales. She exhibits no tenderness.  Neurological: She is alert and oriented to person, place, and time.  Skin: Skin is warm and dry. Capillary refill takes less than 2 seconds. Rash noted. No ecchymosis and no laceration noted. Rash is papular. She is not diaphoretic. No erythema. No pallor.     Psychiatric: She has a normal mood and affect. Her behavior is normal. Judgment and thought content normal.      Assessment & Plan:   1. Tick bite, initial encounter     Tick bite Methyiprednisone acetate 40mg  given in clinic- observed for reactions- none noted Start Prednisone Taper- TOMORROW 05/03/2018 Increase fluids Recommend OTC Claritin in am and OTC Benadryl in evening. If you develop fever, night sweats, GI upset, rash, or joint pain- please call clinic.    FOLLOW-UP:  Return if symptoms worsen or fail to improve.

## 2018-05-02 NOTE — Patient Instructions (Signed)

## 2018-06-10 NOTE — Progress Notes (Deleted)
Cynthia Lawrence D.O. [facility] [address] [place], [place] [address] Phone: [phone] Subjective:    I'm seeing this patient by the request  of:  Danford, Jinny BlossomKaty D, NP   CC:   BJY:[id number]:Subjective  Cynthia Lawrence is a 47 y.o. female coming in with complaint of ***  Onset-  Location Duration-  Character- Aggravating factors- Reliving factors-  Therapies tried-  Severity-     Past Medical History:  Diagnosis Date  . Allergy   . Anxiety   . Asthma   . Chronic tonsillitis   . Depression   . Seasonal allergies    Past Surgical History:  Procedure Laterality Date  . ENDOMETRIAL ABLATION    . EYE SURGERY     lasik  . TONSILLECTOMY Bilateral 08/15/2016   Procedure: TONSILLECTOMY;  Surgeon: Drema Halonhristopher E Newman, MD;  Location: [facility];  Service: ENT;  Laterality: Bilateral;  . WISDOM TOOTH EXTRACTION     Social History   Socioeconomic History  . Marital status: Married    Spouse name: Not on file  . Number of children: Not on file  . Years of education: Not on file  . Highest education level: Not on file  Occupational History  . Not on file  Social Needs  . Financial resource strain: Not on file  . Food insecurity:    Worry: Not on file    Inability: Not on file  . Transportation needs:    Medical: Not on file    Non-medical: Not on file  Tobacco Use  . Smoking status: Never Smoker  . Smokeless tobacco: Never Used  Substance and Sexual Activity  . Alcohol use: No  . Drug use: No  . Sexual activity: Yes    Birth control/protection: Surgical    Comment: ablation  Lifestyle  . Physical activity:    Days per week: Not on file    Minutes per session: Not on file  . Stress: Not on file  Relationships  . Social connections:    Talks on phone: Not on file    Gets together: Not on file    Attends religious service: Not on file    Active member of club or organization: Not on file    Attends meetings of clubs or organizations: Not  on file    Relationship status: Not on file  Other Topics Concern  . Not on file  Social History Narrative  . Not on file   No Known Allergies Family History  Problem Relation Age of Onset  . Diabetes Mother   . Stroke Mother   . Diabetes Father   . Healthy Sister   . Healthy Brother   . Healthy Daughter   . Healthy Son   . Healthy Sister   . Healthy Daughter   . Healthy Daughter     Current Outpatient Medications (Endocrine & Metabolic):  .  predniSONE (DELTASONE) 20 MG tablet, 1 tablet every 12 hrs for three days then one tablet daily for three days  Current Outpatient Medications (Cardiovascular):  .  prazosin (MINIPRESS) 1 MG capsule, Take 3 mg by mouth at bedtime.  Current Outpatient Medications (Respiratory):  .  cetirizine (ZYRTEC) 10 MG tablet, Take 10 mg by mouth daily. .  fluticasone (FLONASE) 50 MCG/ACT nasal spray, Place into both nostrils daily. .  montelukast (SINGULAIR) 10 MG tablet, Take 1 tablet by mouth daily.   Current Outpatient Medications (Hematological):  .  vitamin B-12 (CYANOCOBALAMIN) 100 MCG tablet, Take 100  mcg by mouth daily.  Current Outpatient Medications (Other):  .  cholecalciferol (VITAMIN D) 400 units TABS tablet, Take 400 Units by mouth. .  citalopram (CELEXA) 20 MG tablet, Take 1.5 tablets by mouth daily. .  diazepam (VALIUM) 2 MG tablet, Take 1 mg by mouth at bedtime.    Past medical history, social, surgical and family history all reviewed in electronic medical record.  No pertanent information unless stated regarding to the chief complaint.   Review of Systems:  No headache, visual changes, nausea, vomiting, diarrhea, constipation, dizziness, abdominal pain, skin rash, fevers, chills, night sweats, weight loss, swollen lymph nodes, body aches, joint swelling, muscle aches, chest pain, shortness of breath, mood changes.   Objective  There were no vitals taken for this visit. Systems examined below as of    General: No  apparent distress alert and oriented x3 mood and affect normal, dressed appropriately.  HEENT: Pupils equal, extraocular movements intact  Respiratory: Patient's speak in full sentences and does not appear short of breath  Cardiovascular: No lower extremity edema, non tender, no erythema  Skin: Warm dry intact with no signs of infection or rash on extremities or on axial skeleton.  Abdomen: Soft nontender  Neuro: Cranial nerves II through XII are intact, neurovascularly intact in all extremities with 2+ DTRs and 2+ pulses.  Lymph: No lymphadenopathy of posterior or anterior cervical chain or axillae bilaterally.  Gait normal with good balance and coordination.  MSK:  Non tender with full range of motion and good stability and symmetric strength and tone of shoulders, elbows, wrist, hip, knee and ankles bilaterally.     Impression and Recommendations:     This case required medical decision making of moderate complexity. The above documentation has been reviewed and is accurate and complete Judi Saa, DO       Note: This dictation was prepared with Dragon dictation along with smaller phrase technology. Any transcriptional errors that result from this process are unintentional.

## 2018-06-11 ENCOUNTER — Ambulatory Visit: Payer: 59 | Admitting: Family Medicine

## 2018-08-11 ENCOUNTER — Encounter: Payer: Self-pay | Admitting: Emergency Medicine

## 2018-08-11 DIAGNOSIS — F4310 Post-traumatic stress disorder, unspecified: Secondary | ICD-10-CM

## 2018-08-11 DIAGNOSIS — F514 Sleep terrors [night terrors]: Secondary | ICD-10-CM

## 2018-08-11 DIAGNOSIS — F458 Other somatoform disorders: Secondary | ICD-10-CM | POA: Insufficient documentation

## 2018-08-11 DIAGNOSIS — F329 Major depressive disorder, single episode, unspecified: Secondary | ICD-10-CM | POA: Insufficient documentation

## 2018-08-27 ENCOUNTER — Encounter: Payer: Self-pay | Admitting: Physician Assistant

## 2018-08-27 ENCOUNTER — Encounter (INDEPENDENT_AMBULATORY_CARE_PROVIDER_SITE_OTHER): Payer: Self-pay

## 2018-08-27 ENCOUNTER — Ambulatory Visit (INDEPENDENT_AMBULATORY_CARE_PROVIDER_SITE_OTHER): Payer: 59 | Admitting: Physician Assistant

## 2018-08-27 VITALS — BP 128/86 | HR 80

## 2018-08-27 DIAGNOSIS — F514 Sleep terrors [night terrors]: Secondary | ICD-10-CM

## 2018-08-27 DIAGNOSIS — F458 Other somatoform disorders: Secondary | ICD-10-CM

## 2018-08-27 DIAGNOSIS — F4310 Post-traumatic stress disorder, unspecified: Secondary | ICD-10-CM | POA: Diagnosis not present

## 2018-08-27 DIAGNOSIS — F331 Major depressive disorder, recurrent, moderate: Secondary | ICD-10-CM

## 2018-08-27 MED ORDER — PRAZOSIN HCL 1 MG PO CAPS: 3.0000 mg | ORAL_CAPSULE | Freq: Every day | ORAL | 1 refills | Status: DC

## 2018-08-27 MED ORDER — DIAZEPAM 2 MG PO TABS: 2.0000 mg | ORAL_TABLET | Freq: Two times a day (BID) | ORAL | 1 refills | Status: DC | PRN

## 2018-08-27 MED ORDER — CITALOPRAM HYDROBROMIDE 20 MG PO TABS: 30.0000 mg | ORAL_TABLET | Freq: Every day | ORAL | 1 refills | Status: DC

## 2018-08-27 NOTE — Progress Notes (Signed)
[facility]  Patient ID: Cynthia Lawrence,  MRN: [id number]  PCP: Julaine Fusianford, Katy D, NP  Date of Evaluation: 08/27/2018 Time spent:15 minutes  Chief Complaint:  Chief Complaint    Follow-up      HISTORY/CURRENT STATUS: HPI Here for 6 month med check.  "I'm doing great!  The meds are working well."  Patient denies loss of interest in usual activities and is able to enjoy things.  Denies decreased energy or motivation.  Appetite has not changed.  No extreme sadness, tearfulness, or feelings of hopelessness.  Denies any changes in concentration, making decisions or remembering things.  Denies suicidal or homicidal thoughts.  States the night terrors are much better with the Prazosin at this dose. Denies dizziness.   Sleeps well.  Work is super busy.  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  Loves her job and is making good money. "The [employer] is blessing me so much."   The Valium helps the grinding of teeth.   Individual Medical History/ Review of Systems: Changes? :No    Past medications for mental health diagnoses include: Seroquel, Risperdal, Wellbutrin  Allergies: Patient has no known allergies.  Current Medications:  Current Outpatient Medications:  .  cetirizine (ZYRTEC) 10 MG tablet, Take 10 mg by mouth daily., Disp: , Rfl:  .  citalopram (CELEXA) 20 MG tablet, Take 1.5 tablets by mouth daily., Disp: , Rfl:  .  diazepam (VALIUM) 2 MG tablet, Take 1 mg by mouth at bedtime., Disp: , Rfl:  .  fluticasone (FLONASE) 50 MCG/ACT nasal spray, Place into both nostrils daily., Disp: , Rfl:  .  montelukast (SINGULAIR) 10 MG tablet, Take 1 tablet by mouth daily., Disp: , Rfl:  .  Nutritional Supplements (JUICE PLUS FIBRE PO), Take by mouth daily., Disp: , Rfl:  .  prazosin (MINIPRESS) 1 MG capsule, Take 3 mg by mouth at bedtime., Disp: , Rfl:  .  vitamin B-12 (CYANOCOBALAMIN) 100 MCG tablet, Take 100 mcg by mouth daily., Disp: , Rfl:  Medication Side  Effects: none  Family Medical/ Social History: Changes? No  MENTAL HEALTH EXAM:  There were no vitals taken for this visit.There is no height or weight on file to calculate BMI.  General Appearance: Casual, Well Groomed and Obese  Eye Contact:  Good  Speech:  Clear and Coherent  Volume:  Normal  Mood:  Euthymic  Affect:  Appropriate  Thought Process:  Goal Directed  Orientation:  Full (Time, Place, and Person)  Thought Content: Logical   Suicidal Thoughts:  No  Homicidal Thoughts:  No  Memory:  WNL  Judgement:  Good  Insight:  Good  Psychomotor Activity:  Normal  Concentration:  Concentration: Good  Recall:  Good  Fund of Knowledge: Good  Language: Good  Assets:  Desire for Improvement  ADL's:  Intact  Cognition: WNL  Prognosis:  Good    DIAGNOSES:    ICD-10-CM   1. Major depressive disorder, recurrent episode, moderate (HCC) F33.1   2. PTSD (post-traumatic stress disorder) F43.10   3. Night terrors F51.4   4. Bruxism F45.8     Receiving Psychotherapy: No    RECOMMENDATIONS: I'm glad to see her doing so well! Continue Celexa, Prazosin, Valium as noted above. Return in 6 months.    Melony Overlyeresa Hurst, PA-C

## 2018-12-16 ENCOUNTER — Encounter: Payer: Self-pay | Admitting: Physician Assistant

## 2018-12-16 ENCOUNTER — Ambulatory Visit (INDEPENDENT_AMBULATORY_CARE_PROVIDER_SITE_OTHER): Payer: BLUE CROSS/BLUE SHIELD | Admitting: Physician Assistant

## 2018-12-16 ENCOUNTER — Telehealth: Payer: Self-pay | Admitting: Physician Assistant

## 2018-12-16 ENCOUNTER — Other Ambulatory Visit: Payer: Self-pay

## 2018-12-16 VITALS — BP 139/86 | HR 92

## 2018-12-16 DIAGNOSIS — F331 Major depressive disorder, recurrent, moderate: Secondary | ICD-10-CM | POA: Diagnosis not present

## 2018-12-16 DIAGNOSIS — F4310 Post-traumatic stress disorder, unspecified: Secondary | ICD-10-CM

## 2018-12-16 DIAGNOSIS — F900 Attention-deficit hyperactivity disorder, predominantly inattentive type: Secondary | ICD-10-CM

## 2018-12-16 DIAGNOSIS — F514 Sleep terrors [night terrors]: Secondary | ICD-10-CM

## 2018-12-16 MED ORDER — AMPHETAMINE-DEXTROAMPHETAMINE 10 MG PO TABS: 10.0000 mg | ORAL_TABLET | Freq: Every day | ORAL | 0 refills | Status: DC

## 2018-12-16 NOTE — Telephone Encounter (Signed)
Patient would to speak with you concerning a new medication

## 2018-12-16 NOTE — Progress Notes (Signed)
[facility]  Patient ID: Cynthia Lawrence,  MRN: [id number]  PCP: Julaine Fusi, NP  Date of Evaluation: 12/17/2018 Time spent:15 minutes  Chief Complaint:  Chief Complaint    Follow-up      HISTORY/CURRENT STATUS: HPI Here to discuss possible ADHD.  Her sister has told her she may have ADHD. Pt's sister has it.  Pt has been easily distracted and had a short attn span for a long time. Will make lists and has no motivation to do things on the list.  She'll get overwhelmed with so many things on her plate, and she has trouble following steps to finish things.  Patient denies loss of interest in usual activities and is able to enjoy things.  Denies decreased energy or motivation.  Appetite has not changed.  No extreme sadness, tearfulness, or feelings of hopelessness.  Denies any changes in concentration, making decisions or remembering things.  Denies suicidal or homicidal thoughts.  Patient denies increased energy with decreased need for sleep, no increased talkativeness, no racing thoughts, no impulsivity or risky behaviors, no increased spending, no increased libido, no grandiosity.  Anxiety is much better. Not needing the Valium except rarely.  Denies muscle or joint pain, stiffness, or dystonia.  Denies dizziness, syncope, seizures, numbness, tingling, tremor, tics, unsteady gait, slurred speech, confusion.   Individual Medical History/ Review of Systems: Changes? :No    Past medications for mental health diagnoses include: Seroquel, Risperdal, Wellbutrin  Allergies: Patient has no known allergies.  Current Medications:  Current Outpatient Medications:  .  cetirizine (ZYRTEC) 10 MG tablet, Take 10 mg by mouth daily., Disp: , Rfl:  .  citalopram (CELEXA) 20 MG tablet, Take 1.5 tablets (30 mg total) by mouth daily., Disp: 135 tablet, Rfl: 1 .  fluticasone (FLONASE) 50 MCG/ACT nasal spray, Place into both nostrils daily., Disp: , Rfl:  .  Nutritional Supplements  (JUICE PLUS FIBRE PO), Take by mouth daily., Disp: , Rfl:  .  prazosin (MINIPRESS) 1 MG capsule, Take 3 capsules (3 mg total) by mouth at bedtime., Disp: 270 capsule, Rfl: 1 .  vitamin B-12 (CYANOCOBALAMIN) 100 MCG tablet, Take 100 mcg by mouth daily., Disp: , Rfl:  .  amphetamine-dextroamphetamine (ADDERALL) 10 MG tablet, Take 1 tablet (10 mg total) by mouth daily with breakfast., Disp: 30 tablet, Rfl: 0 .  montelukast (SINGULAIR) 10 MG tablet, Take 1 tablet by mouth daily., Disp: , Rfl:  Medication Side Effects: none  Family Medical/ Social History: Changes? No  MENTAL HEALTH EXAM:  Blood pressure 139/86, pulse 92.There is no height or weight on file to calculate BMI.  General Appearance: Casual, Well Groomed and Obese  Eye Contact:  Good  Speech:  Clear and Coherent  Volume:  Normal  Mood:  Euthymic  Affect:  Appropriate  Thought Process:  Goal Directed  Orientation:  Full (Time, Place, and Person)  Thought Content: Logical   Suicidal Thoughts:  No  Homicidal Thoughts:  No  Memory:  WNL  Judgement:  Good  Insight:  Good  Psychomotor Activity:  Normal  Concentration:  Concentration: Fair and Attention Span: Fair  Recall:  Good  Fund of Knowledge: Good  Language: Good  Assets:  Desire for Improvement  ADL's:  Intact  Cognition: WNL  Prognosis:  Good    DIAGNOSES:    ICD-10-CM   1. Major depressive disorder, recurrent episode, moderate (HCC) F33.1   2. PTSD (post-traumatic stress disorder) F43.10   3. Night terrors F51.4     Receiving  Psychotherapy: No    RECOMMENDATIONS:  Discussed ADHD/ADD and the possibility of that dx.  It does sound like she has some of the sx and I'm ok to see how a stimulant will work for her.  She must go off the Valium and can't take any other benzos. She understands and accepts. Start Adderall 10 mg p.o. every morning after breakfast. Continue Celexa 30 mg p.o. every morning. Continue prazosin 3 mg nightly. Return in 4 to 6  weeks.   Melony Overly, PA-C   This record has been created using [employer].  Chart creation errors have been sought, but may not always have been located and corrected. Such creation errors do not reflect on the standard of medical care.

## 2019-01-11 ENCOUNTER — Other Ambulatory Visit: Payer: Self-pay | Admitting: Physician Assistant

## 2019-01-12 NOTE — Telephone Encounter (Signed)
Last fill 03/23 Next visit 04/23

## 2019-01-16 ENCOUNTER — Ambulatory Visit (INDEPENDENT_AMBULATORY_CARE_PROVIDER_SITE_OTHER): Payer: BLUE CROSS/BLUE SHIELD | Admitting: Physician Assistant

## 2019-01-16 ENCOUNTER — Encounter: Payer: Self-pay | Admitting: Physician Assistant

## 2019-01-16 ENCOUNTER — Other Ambulatory Visit: Payer: Self-pay

## 2019-01-16 DIAGNOSIS — F458 Other somatoform disorders: Secondary | ICD-10-CM | POA: Diagnosis not present

## 2019-01-16 DIAGNOSIS — F4310 Post-traumatic stress disorder, unspecified: Secondary | ICD-10-CM | POA: Diagnosis not present

## 2019-01-16 DIAGNOSIS — F900 Attention-deficit hyperactivity disorder, predominantly inattentive type: Secondary | ICD-10-CM | POA: Diagnosis not present

## 2019-01-16 DIAGNOSIS — F331 Major depressive disorder, recurrent, moderate: Secondary | ICD-10-CM

## 2019-01-16 NOTE — Progress Notes (Signed)
[facility]  Patient ID: Cynthia Lawrence,  MRN: [id number]  PCP: Julaine Fusi, NP  Date of Evaluation: 01/16/2019 Time spent:15 minutes  Chief Complaint:  Chief Complaint    Follow-up     Virtual Visit via Telephone Note  I connected with patient by a video enabled telemedicine application or telephone, with their informed consent, and verified patient privacy and that I am speaking with the correct person using two identifiers.  I am private, in my home and the patient is home.   I discussed the limitations, risks, security and privacy concerns of performing an evaluation and management service by telephone and the availability of in person appointments. I also discussed with the patient that there may be a patient responsible charge related to this service. The patient expressed understanding and agreed to proceed.   I discussed the assessment and treatment plan with the patient. The patient was provided an opportunity to ask questions and all were answered. The patient agreed with the plan and demonstrated an understanding of the instructions.   The patient was advised to call back or seek an in-person evaluation if the symptoms worsen or if the condition fails to improve as anticipated.  I provided 15 minutes of non-face-to-face time during this encounter.  HISTORY/CURRENT STATUS: HPI For routine med check.  We added Adderall.  States she's doing really well!  "I think it's working great!  It's been a Secretary/administrator for me. I'm able to concentrate better and I don't get distracted so easy."  No SE at all.  Patient denies loss of interest in usual activities and is able to enjoy things.  Denies decreased energy or motivation.  Appetite has not changed.  No extreme sadness, tearfulness, or feelings of hopelessness.  Denies any changes in concentration, making decisions or remembering things.  Denies suicidal or homicidal thoughts.   Patient denies increased energy with  decreased need for sleep, no increased talkativeness, no racing thoughts, no impulsivity or risky behaviors, no increased spending, no increased libido, no grandiosity.  Not using the Valium at all, not needing it for bruxism.  Sleeps well.  Having some marital problems with communication. Might try to get back in for counseling.   Individual Medical History/ Review of Systems: Changes? :No    Past medications for mental health diagnoses include: Seroquel, Risperdal, Wellbutrin  Allergies: Patient has no known allergies.  Current Medications:  Current Outpatient Medications:  .  amphetamine-dextroamphetamine (ADDERALL) 10 MG tablet, TAKE 1 TABLET BY MOUTH DAILY WITH BREAKFAST, Disp: 30 tablet, Rfl: 0 .  cetirizine (ZYRTEC) 10 MG tablet, Take 10 mg by mouth daily., Disp: , Rfl:  .  citalopram (CELEXA) 20 MG tablet, Take 1.5 tablets (30 mg total) by mouth daily., Disp: 135 tablet, Rfl: 1 .  fluticasone (FLONASE) 50 MCG/ACT nasal spray, Place into both nostrils daily., Disp: , Rfl:  .  montelukast (SINGULAIR) 10 MG tablet, Take 1 tablet by mouth daily., Disp: , Rfl:  .  Nutritional Supplements (JUICE PLUS FIBRE PO), Take by mouth daily., Disp: , Rfl:  .  prazosin (MINIPRESS) 1 MG capsule, Take 3 capsules (3 mg total) by mouth at bedtime., Disp: 270 capsule, Rfl: 1 .  vitamin B-12 (CYANOCOBALAMIN) 100 MCG tablet, Take 100 mcg by mouth daily., Disp: , Rfl:  Medication Side Effects: none  Family Medical/ Social History: Changes? Yes due to coronavirus pandemic, social distancing  MENTAL HEALTH EXAM:  There were no vitals taken for this visit.There is no height or  weight on file to calculate BMI.  General Appearance: phone visit, unable to assess  Eye Contact:  unable to assess  Speech:  Clear and Coherent  Volume:  Normal  Mood:  Euthymic  Affect:  unable to assess  Thought Process:  Goal Directed  Orientation:  Full (Time, Place, and Person)  Thought Content: Logical   Suicidal  Thoughts:  No  Homicidal Thoughts:  No  Memory:  WNL  Judgement:  Good  Insight:  Good  Psychomotor Activity:  unable to assess  Concentration:  Concentration: Good and Attention Span: Good  Recall:  Good  Fund of Knowledge: Good  Language: Good  Assets:  Desire for Improvement  ADL's:  Intact  Cognition: WNL  Prognosis:  Good    DIAGNOSES:    ICD-10-CM   1. Major depressive disorder, recurrent episode, moderate (HCC) F33.1   2. PTSD (post-traumatic stress disorder) F43.10   3. Attention deficit hyperactivity disorder (ADHD), predominantly inattentive type F90.0   4. Bruxism F45.8     Receiving Psychotherapy: No    RECOMMENDATIONS:  Continue Adderall 10 mg q am. Continue Celexa 30 mg qd. Continue prazosin 1 mg, 3 nightly. Strongly recommend she get back in for psychotherapy. Return in 3 months.   Melony Overlyeresa Ramona Slinger, PA-C   This record has been created using [employer] software.  Chart creation errors have been sought, but may not always have been located and corrected. Such creation errors do not reflect on the standard of medical care.

## 2019-02-13 ENCOUNTER — Other Ambulatory Visit: Payer: Self-pay | Admitting: Physician Assistant

## 2019-02-14 NOTE — Telephone Encounter (Signed)
Next appt 06/02 Last fill 04/20

## 2019-02-25 ENCOUNTER — Other Ambulatory Visit: Payer: Self-pay

## 2019-02-25 ENCOUNTER — Encounter: Payer: Self-pay | Admitting: Physician Assistant

## 2019-02-25 ENCOUNTER — Ambulatory Visit (INDEPENDENT_AMBULATORY_CARE_PROVIDER_SITE_OTHER): Payer: BLUE CROSS/BLUE SHIELD | Admitting: Physician Assistant

## 2019-02-25 VITALS — BP 132/79 | HR 76

## 2019-02-25 DIAGNOSIS — F4310 Post-traumatic stress disorder, unspecified: Secondary | ICD-10-CM | POA: Diagnosis not present

## 2019-02-25 DIAGNOSIS — R413 Other amnesia: Secondary | ICD-10-CM

## 2019-02-25 DIAGNOSIS — F331 Major depressive disorder, recurrent, moderate: Secondary | ICD-10-CM | POA: Diagnosis not present

## 2019-02-25 DIAGNOSIS — F514 Sleep terrors [night terrors]: Secondary | ICD-10-CM

## 2019-02-25 DIAGNOSIS — F900 Attention-deficit hyperactivity disorder, predominantly inattentive type: Secondary | ICD-10-CM

## 2019-02-25 MED ORDER — AMPHETAMINE-DEXTROAMPHETAMINE 10 MG PO TABS: 10.0000 mg | ORAL_TABLET | Freq: Every day | ORAL | 0 refills | Status: DC

## 2019-02-25 MED ORDER — PRAZOSIN HCL 1 MG PO CAPS: 3.0000 mg | ORAL_CAPSULE | Freq: Every day | ORAL | 1 refills | Status: DC

## 2019-02-25 MED ORDER — CITALOPRAM HYDROBROMIDE 20 MG PO TABS: 30.0000 mg | ORAL_TABLET | Freq: Every day | ORAL | 1 refills | Status: DC
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
## 2019-03-05 ENCOUNTER — Other Ambulatory Visit: Payer: Self-pay

## 2019-03-05 ENCOUNTER — Ambulatory Visit (INDEPENDENT_AMBULATORY_CARE_PROVIDER_SITE_OTHER): Payer: BLUE CROSS/BLUE SHIELD | Admitting: Psychiatry

## 2019-03-05 DIAGNOSIS — F4310 Post-traumatic stress disorder, unspecified: Secondary | ICD-10-CM

## 2019-03-05 DIAGNOSIS — Z630 Problems in relationship with spouse or partner: Secondary | ICD-10-CM | POA: Diagnosis not present

## 2019-03-05 DIAGNOSIS — F331 Major depressive disorder, recurrent, moderate: Secondary | ICD-10-CM
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
## 2019-03-18 ENCOUNTER — Other Ambulatory Visit: Payer: Self-pay

## 2019-03-18 ENCOUNTER — Ambulatory Visit (INDEPENDENT_AMBULATORY_CARE_PROVIDER_SITE_OTHER): Payer: BC Managed Care – PPO | Admitting: Psychiatry

## 2019-03-18 DIAGNOSIS — F331 Major depressive disorder, recurrent, moderate: Secondary | ICD-10-CM

## 2019-03-18 DIAGNOSIS — Z630 Problems in relationship with spouse or partner: Secondary | ICD-10-CM | POA: Diagnosis not present

## 2019-03-18 DIAGNOSIS — Z8659 Personal history of other mental and behavioral disorders: Secondary | ICD-10-CM
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
## 2019-03-31 ENCOUNTER — Ambulatory Visit: Payer: BLUE CROSS/BLUE SHIELD | Admitting: Psychiatry

## 2019-04-11 ENCOUNTER — Telehealth: Payer: Self-pay | Admitting: Physician Assistant

## 2019-04-11 ENCOUNTER — Other Ambulatory Visit: Payer: Self-pay | Admitting: Physician Assistant

## 2019-04-11 MED ORDER — AMPHETAMINE-DEXTROAMPHETAMINE 5 MG PO TABS: 5.0000 mg | ORAL_TABLET | Freq: Two times a day (BID) | ORAL | 0 refills | Status: DC

## 2019-04-11 NOTE — Telephone Encounter (Signed)
Pt on Adderall receiving 30 a mos .Woud like to change to 5mg  receiving 60 if possible.

## 2019-04-11 NOTE — Telephone Encounter (Signed)
Done

## 2019-04-11 NOTE — Telephone Encounter (Signed)
Thanks for the translation. ;)  i'll need her pharmacy please.

## 2019-04-14 ENCOUNTER — Ambulatory Visit: Payer: BC Managed Care – PPO | Admitting: Psychiatry

## 2019-04-15 ENCOUNTER — Telehealth: Payer: Self-pay | Admitting: Physician Assistant

## 2019-04-15 NOTE — Telephone Encounter (Signed)
This was already submitted for Adderall 5 mg bid, pt called last week, needs to check with pharmacy

## 2019-04-15 NOTE — Telephone Encounter (Signed)
Pt called to request refill on Adderall. She takes 10 mg. Cuts in half and takes 1/2 @ AM and other half about 1-2 PM..  Has hard time crumbles when cutting in half. Ask for 5 mg 2/d if available. [facility]

## 2019-04-17 ENCOUNTER — Ambulatory Visit: Payer: BLUE CROSS/BLUE SHIELD | Admitting: Physician Assistant

## 2019-05-06 DIAGNOSIS — N920 Excessive and frequent menstruation with regular cycle: Secondary | ICD-10-CM | POA: Diagnosis not present

## 2019-05-06 DIAGNOSIS — Z01419 Encounter for gynecological examination (general) (routine) without abnormal findings: Secondary | ICD-10-CM | POA: Diagnosis not present

## 2019-05-06 DIAGNOSIS — R3914 Feeling of incomplete bladder emptying: Secondary | ICD-10-CM | POA: Diagnosis not present

## 2019-05-06 DIAGNOSIS — Z1239 Encounter for other screening for malignant neoplasm of breast: Secondary | ICD-10-CM | POA: Diagnosis not present

## 2019-05-06 DIAGNOSIS — N3946 Mixed incontinence: Secondary | ICD-10-CM | POA: Diagnosis not present

## 2019-05-06 DIAGNOSIS — Z124 Encounter for screening for malignant neoplasm of cervix: Secondary | ICD-10-CM | POA: Diagnosis not present

## 2019-05-20 ENCOUNTER — Other Ambulatory Visit: Payer: Self-pay | Admitting: Physician Assistant

## 2019-05-20 NOTE — Telephone Encounter (Signed)
appt 09/02

## 2019-05-28 ENCOUNTER — Encounter: Payer: Self-pay | Admitting: Physician Assistant

## 2019-05-28 ENCOUNTER — Other Ambulatory Visit: Payer: Self-pay

## 2019-05-28 ENCOUNTER — Ambulatory Visit (INDEPENDENT_AMBULATORY_CARE_PROVIDER_SITE_OTHER): Payer: BC Managed Care – PPO | Admitting: Physician Assistant

## 2019-05-28 DIAGNOSIS — F331 Major depressive disorder, recurrent, moderate: Secondary | ICD-10-CM

## 2019-05-28 DIAGNOSIS — F900 Attention-deficit hyperactivity disorder, predominantly inattentive type: Secondary | ICD-10-CM | POA: Diagnosis not present

## 2019-05-28 NOTE — Progress Notes (Signed)
[facility]  Patient ID: Cynthia Lawrence,  MRN: [id number]  PCP: Julaine Fusianford, Katy D, NP  Date of Evaluation: 05/28/2019 Time spent:15 minutes  Chief Complaint:  Chief Complaint    ADHD; Depression; Follow-up      Virtual Visit via Telephone Note  I connected with patient by a video enabled telemedicine application or telephone, with their informed consent, and verified patient privacy and that I am speaking with the correct person using two identifiers.  I am private, in my home and the patient is at work.   I discussed the limitations, risks, security and privacy concerns of performing an evaluation and management service by telephone and the availability of in person appointments. I also discussed with the patient that there may be a patient responsible charge related to this service. The patient expressed understanding and agreed to proceed.   I discussed the assessment and treatment plan with the patient. The patient was provided an opportunity to ask questions and all were answered. The patient agreed with the plan and demonstrated an understanding of the instructions.   The patient was advised to call back or seek an in-person evaluation if the symptoms worsen or if the condition fails to improve as anticipated.  I provided 15 minutes of non-face-to-face time during this encounter.  HISTORY/CURRENT STATUS: HPI For routine 3 month med check.   Doing fine. Only prob is still bruxism but she's seeing chiropractor and hopes that helps. No longer having nightmares!  She weaned herself off the Prazosin b/c she felt like it might be causing her to be tired and sluggish the next day.  After d/c it, does feel better.  Sleeps well.  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   Adderall is working very well.  Able to stay focused and finish things much better. For now, feels that the dose is good, but sometimes thinks another dose might help.   Patient denies loss of  interest in usual activities and is able to enjoy things.  Denies decreased energy or motivation.  Appetite has not changed.  No extreme sadness, tearfulness, or feelings of hopelessness.  Denies any changes in concentration, making decisions or remembering things.  Denies suicidal or homicidal thoughts.  Patient denies increased energy with decreased need for sleep, no increased talkativeness, no racing thoughts, no impulsivity or risky behaviors, no increased spending, no increased libido, no grandiosity.  Denies dizziness, syncope, seizures, numbness, tingling, tremor, tics, unsteady gait, slurred speech, confusion. Denies muscle or joint pain, stiffness, or dystonia.  Individual Medical History/ Review of Systems: Changes? :No    Past medications for mental health diagnoses include: Seroquel, Risperdal, Wellbutrin  Allergies: Patient has no known allergies.  Current Medications:  Current Outpatient Medications:  .  amphetamine-dextroamphetamine (ADDERALL) 5 MG tablet, TAKE 1 TABLET BY MOUTH TWICE A DAY, Disp: 60 tablet, Rfl: 0 .  cetirizine (ZYRTEC) 10 MG tablet, Take 10 mg by mouth daily., Disp: , Rfl:  .  citalopram (CELEXA) 20 MG tablet, TAKE 1 AND 1/2 TABLETS BY MOUTH DAILY, Disp: 135 tablet, Rfl: 1 .  fluticasone (FLONASE) 50 MCG/ACT nasal spray, Place into both nostrils daily., Disp: , Rfl:  .  Nutritional Supplements (JUICE PLUS FIBRE PO), Take by mouth daily., Disp: , Rfl:  .  vitamin B-12 (CYANOCOBALAMIN) 100 MCG tablet, Take 100 mcg by mouth daily., Disp: , Rfl:  .  montelukast (SINGULAIR) 10 MG tablet, Take 1 tablet by mouth daily., Disp: , Rfl:  Medication Side Effects:  none  Family Medical/ Social History: Changes? No  MENTAL HEALTH EXAM:  There were no vitals taken for this visit.There is no height or weight on file to calculate BMI.  General Appearance: unable to assess  Eye Contact:  unable to assess  Speech:  Clear and Coherent  Volume:  Normal  Mood:  Euthymic   Affect:  unable to assess  Thought Process:  Goal Directed  Orientation:  Full (Time, Place, and Person)  Thought Content: Logical   Suicidal Thoughts:  No  Homicidal Thoughts:  No  Memory:  WNL  Judgement:  Good  Insight:  Good  Psychomotor Activity:  unable to assess  Concentration:  Concentration: Good and Attention Span: Good  Recall:  Good  Fund of Knowledge: Good  Language: Good  Assets:  Desire for Improvement  ADL's:  Intact  Cognition: WNL  Prognosis:  Good    DIAGNOSES:    ICD-10-CM   1. Major depressive disorder, recurrent episode, moderate (HCC)  F33.1   2. Attention deficit hyperactivity disorder (ADHD), predominantly inattentive type  F90.0     Receiving Psychotherapy: No    RECOMMENDATIONS:  Continue Celexa 20 mg, 1.5 pills daily. Continue Adderall 5 mg twice daily.  We discussed the fact that we can increase to 5 mg 3 times daily but not to take the last dose too late or else she may have trouble sleeping.  She will call if she feels like she needs more coverage throughout the day.  Another way to take it would be taking 10 mg in the morning and 5 mg in the afternoon.  For now we will leave it the same but if she feels like it needs to be changed in the future she can just call. PDMP was reviewed. Return in 6 months.  Donnal Moat, PA-C   This record has been created using [employer].  Chart creation errors have been sought, but may not always have been located and corrected. Such creation errors do not reflect on the standard of medical care.

## 2019-06-06 DIAGNOSIS — R3914 Feeling of incomplete bladder emptying: Secondary | ICD-10-CM | POA: Diagnosis not present

## 2019-06-09 DIAGNOSIS — G44209 Tension-type headache, unspecified, not intractable: Secondary | ICD-10-CM | POA: Diagnosis not present

## 2019-06-09 DIAGNOSIS — M9901 Segmental and somatic dysfunction of cervical region: Secondary | ICD-10-CM | POA: Diagnosis not present

## 2019-06-09 DIAGNOSIS — M9902 Segmental and somatic dysfunction of thoracic region: Secondary | ICD-10-CM | POA: Diagnosis not present

## 2019-06-09 DIAGNOSIS — M9903 Segmental and somatic dysfunction of lumbar region: Secondary | ICD-10-CM | POA: Diagnosis not present

## 2019-06-16 ENCOUNTER — Other Ambulatory Visit: Payer: Self-pay | Admitting: Physician Assistant

## 2019-06-17 NOTE — Telephone Encounter (Signed)
Last visit 05/28/2019

## 2019-06-20 DIAGNOSIS — G44209 Tension-type headache, unspecified, not intractable: Secondary | ICD-10-CM | POA: Diagnosis not present

## 2019-06-20 DIAGNOSIS — M9901 Segmental and somatic dysfunction of cervical region: Secondary | ICD-10-CM | POA: Diagnosis not present

## 2019-06-20 DIAGNOSIS — M9902 Segmental and somatic dysfunction of thoracic region: Secondary | ICD-10-CM | POA: Diagnosis not present

## 2019-06-20 DIAGNOSIS — M9903 Segmental and somatic dysfunction of lumbar region: Secondary | ICD-10-CM | POA: Diagnosis not present

## 2019-06-30 DIAGNOSIS — M9901 Segmental and somatic dysfunction of cervical region: Secondary | ICD-10-CM | POA: Diagnosis not present

## 2019-06-30 DIAGNOSIS — M9902 Segmental and somatic dysfunction of thoracic region: Secondary | ICD-10-CM | POA: Diagnosis not present

## 2019-06-30 DIAGNOSIS — G44209 Tension-type headache, unspecified, not intractable: Secondary | ICD-10-CM | POA: Diagnosis not present

## 2019-06-30 DIAGNOSIS — M9903 Segmental and somatic dysfunction of lumbar region: Secondary | ICD-10-CM | POA: Diagnosis not present

## 2019-07-18 DIAGNOSIS — M9902 Segmental and somatic dysfunction of thoracic region: Secondary | ICD-10-CM | POA: Diagnosis not present

## 2019-07-18 DIAGNOSIS — M9903 Segmental and somatic dysfunction of lumbar region: Secondary | ICD-10-CM | POA: Diagnosis not present

## 2019-07-18 DIAGNOSIS — M9901 Segmental and somatic dysfunction of cervical region: Secondary | ICD-10-CM | POA: Diagnosis not present

## 2019-07-18 DIAGNOSIS — G44209 Tension-type headache, unspecified, not intractable: Secondary | ICD-10-CM | POA: Diagnosis not present

## 2019-07-25 ENCOUNTER — Other Ambulatory Visit: Payer: Self-pay | Admitting: Physician Assistant

## 2019-07-25 NOTE — Telephone Encounter (Signed)
Pt has been taking Adderall 5mg   (2 in the am ) and (1 in the pm). Can we send this new RX in for her. Not sure the best formulation needed as insurance may require PA. She uses [facility]. Also, she wants to discuss if you will write a letter for her about having a therapy dog.

## 2019-07-28 ENCOUNTER — Other Ambulatory Visit: Payer: Self-pay | Admitting: Physician Assistant

## 2019-07-28 MED ORDER — AMPHETAMINE-DEXTROAMPHETAMINE 5 MG PO TABS: ORAL_TABLET | ORAL | 0 refills | Status: DC

## 2019-07-28 NOTE — Telephone Encounter (Signed)
I sent in #90 for Adderall.  Please get more specifics about the dog.  Is this about her or her dtr (she had asked me that in the past and I'm not writing anything if not a pt here.)

## 2019-07-28 NOTE — Telephone Encounter (Signed)
Did you review message? Looks like patient is taking 3/day

## 2019-07-29 NOTE — Telephone Encounter (Signed)
Patient called back and says it's for her the patient.

## 2019-07-29 NOTE — Telephone Encounter (Signed)
Left voice mail to call back 

## 2019-08-13 DIAGNOSIS — N3946 Mixed incontinence: Secondary | ICD-10-CM | POA: Diagnosis not present

## 2019-08-13 DIAGNOSIS — R350 Frequency of micturition: Secondary | ICD-10-CM | POA: Diagnosis not present

## 2019-08-13 DIAGNOSIS — N398 Other specified disorders of urinary system: Secondary | ICD-10-CM | POA: Diagnosis not present

## 2019-08-14 ENCOUNTER — Telehealth: Payer: Self-pay | Admitting: Physician Assistant

## 2019-08-14 NOTE — Telephone Encounter (Signed)
On note for 10/30 pt had requested a letter for herself for an emotional support animal. She is checking on the status of this letter. Thanks

## 2019-08-14 NOTE — Telephone Encounter (Signed)
Note dictated, waiting to be typed.

## 2019-08-15 DIAGNOSIS — M9901 Segmental and somatic dysfunction of cervical region: Secondary | ICD-10-CM | POA: Diagnosis not present

## 2019-08-15 DIAGNOSIS — M9902 Segmental and somatic dysfunction of thoracic region: Secondary | ICD-10-CM | POA: Diagnosis not present

## 2019-08-15 DIAGNOSIS — M9903 Segmental and somatic dysfunction of lumbar region: Secondary | ICD-10-CM | POA: Diagnosis not present

## 2019-08-15 DIAGNOSIS — G44209 Tension-type headache, unspecified, not intractable: Secondary | ICD-10-CM | POA: Diagnosis not present

## 2019-10-17 ENCOUNTER — Other Ambulatory Visit: Payer: Self-pay | Admitting: Physician Assistant

## 2019-10-17 NOTE — Telephone Encounter (Signed)
Last apt 05/2019 due back 6 months

## 2019-11-03 ENCOUNTER — Ambulatory Visit: Payer: 59 | Admitting: Adult Health

## 2019-11-05 DIAGNOSIS — M25511 Pain in right shoulder: Secondary | ICD-10-CM | POA: Diagnosis not present

## 2019-11-05 DIAGNOSIS — M545 Low back pain: Secondary | ICD-10-CM | POA: Diagnosis not present

## 2019-11-05 DIAGNOSIS — M542 Cervicalgia: Secondary | ICD-10-CM | POA: Diagnosis not present

## 2019-11-05 DIAGNOSIS — M791 Myalgia, unspecified site: Secondary | ICD-10-CM | POA: Diagnosis not present

## 2019-11-12 DIAGNOSIS — M25511 Pain in right shoulder: Secondary | ICD-10-CM | POA: Diagnosis not present

## 2019-11-12 DIAGNOSIS — M791 Myalgia, unspecified site: Secondary | ICD-10-CM | POA: Diagnosis not present

## 2019-11-12 DIAGNOSIS — M542 Cervicalgia: Secondary | ICD-10-CM | POA: Diagnosis not present

## 2019-11-12 DIAGNOSIS — M545 Low back pain: Secondary | ICD-10-CM | POA: Diagnosis not present

## 2019-11-14 DIAGNOSIS — M545 Low back pain: Secondary | ICD-10-CM | POA: Diagnosis not present

## 2019-11-14 DIAGNOSIS — M791 Myalgia, unspecified site: Secondary | ICD-10-CM | POA: Diagnosis not present

## 2019-11-14 DIAGNOSIS — M25511 Pain in right shoulder: Secondary | ICD-10-CM | POA: Diagnosis not present

## 2019-11-14 DIAGNOSIS — M542 Cervicalgia: Secondary | ICD-10-CM | POA: Diagnosis not present

## 2019-12-08 ENCOUNTER — Emergency Department (HOSPITAL_BASED_OUTPATIENT_CLINIC_OR_DEPARTMENT_OTHER)
Admission: EM | Admit: 2019-12-08 | Discharge: 2019-12-08 | Disposition: A | Discharge status: Home / Self Care | Payer: BC Managed Care – PPO | Attending: Emergency Medicine | Admitting: Emergency Medicine

## 2019-12-08 ENCOUNTER — Other Ambulatory Visit: Payer: Self-pay

## 2019-12-08 ENCOUNTER — Encounter (HOSPITAL_BASED_OUTPATIENT_CLINIC_OR_DEPARTMENT_OTHER): Payer: Self-pay

## 2019-12-08 DIAGNOSIS — R110 Nausea: Secondary | ICD-10-CM | POA: Insufficient documentation

## 2019-12-08 DIAGNOSIS — Z79899 Other long term (current) drug therapy: Secondary | ICD-10-CM | POA: Insufficient documentation

## 2019-12-08 DIAGNOSIS — M542 Cervicalgia: Secondary | ICD-10-CM | POA: Insufficient documentation

## 2019-12-08 DIAGNOSIS — M546 Pain in thoracic spine: Secondary | ICD-10-CM | POA: Diagnosis not present

## 2019-12-08 DIAGNOSIS — J45909 Unspecified asthma, uncomplicated: Secondary | ICD-10-CM | POA: Insufficient documentation

## 2019-12-08 DIAGNOSIS — R519 Headache, unspecified: Secondary | ICD-10-CM | POA: Insufficient documentation

## 2019-12-08 MED ORDER — LIDOCAINE 5 % EX PTCH: 1.0000 | MEDICATED_PATCH | CUTANEOUS | 0 refills | Status: DC

## 2019-12-08 MED ORDER — METHOCARBAMOL 750 MG PO TABS: 750.0000 mg | ORAL_TABLET | Freq: Two times a day (BID) | ORAL | 0 refills | Status: DC | PRN

## 2019-12-08 MED ORDER — IBUPROFEN 400 MG PO TABS
600.0000 mg | ORAL_TABLET | Freq: Once | ORAL | Status: AC
  Administered 2019-12-08: 15:00:00 600 mg via ORAL
  Filled 2019-12-08: qty 1

## 2019-12-08 MED FILL — METHOCARBAMOL 750 MG TABS: 750 | 10 days supply | Qty: 20 | Fill #0

## 2019-12-08 NOTE — Discharge Instructions (Addendum)
  Expect your soreness to increase over the next 2-3 days. Take it easy, but do not lay around too much as this may make any stiffness worse.  Antiinflammatory medications: Take 600 mg of ibuprofen every 6 hours or 440 mg (over the counter dose) to 500 mg (prescription dose) of naproxen every 12 hours for the next 3 days. After this time, these medications may be used as needed for pain. Take these medications with food to avoid upset stomach. Choose only one of these medications, do not take them together. Acetaminophen (generic for Tylenol): Should you continue to have additional pain while taking the ibuprofen or naproxen, you may add in acetaminophen as needed. Your daily total maximum amount of acetaminophen from all sources should be limited to 4000mg /day for persons without liver problems, or 2000mg /day for those with liver problems. Methocarbamol: Methocarbamol (generic for Robaxin) is a muscle relaxer and can help relieve stiff muscles or muscle spasms.  Do not drive or perform other dangerous activities while taking this medication as it can cause drowsiness as well as changes in reaction time and judgement. Lidocaine patches: These are available via either prescription or over-the-counter. The over-the-counter option may be more economical one and are likely just as effective. There are multiple over-the-counter brands, such as Salonpas. Ice: May apply ice to the area over the next 24 hours for 15 minutes at a time to reduce pain, inflammation, and swelling, if present. Exercises: Be sure to perform the attached exercises starting with three times a week and working up to performing them daily. This is an essential part of preventing long term problems.  Follow up: Follow up with a primary care provider for any future management of these complaints. Be sure to follow up within 7-10 days. Return: Return to the ED should symptoms worsen.  For prescription assistance, may try using prescription  discount sites or apps, such as goodrx.com   Head Injury or equivalent concussion symptoms You have been seen today for an injury. It does not appear to be serious at this time.  Close observation: The close observation period is usually 6 hours from the injury. This includes staying awake and having a trustworthy adult monitor you to assure your condition does not worsen. You should be in regular contact with this person and ideally, they should be able to monitor you in person.  Secondary observation: The secondary observation period is usually 24 hours from the injury. You are allowed to sleep during this time. A trustworthy adult should intermittently monitor you to assure your condition does not worsen.   Overall head injury/concussion care: Rest: Be sure to get plenty of rest. You will need more rest and sleep while you recover. Hydration: Be sure to stay well hydrated by having a goal of drinking about 0.5 liters of water an hour.  Return to sports and activities: In general, you may return to normal activities once symptoms have subsided, however, you would ideally be cleared by a primary care provider or other qualified medical professional prior to return to these activities.  Follow up: Follow up with the concussion clinic or your primary care provider for further management of this issue. Return: Return to the ED should you begin to have confusion, abnormal behavior, aggression, violence, or personality changes, repeated vomiting, vision loss, numbness or weakness on one side of the body, difficulty standing due to dizziness, significantly worsening pain, or any other major concerns.
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
## 2019-12-11 ENCOUNTER — Ambulatory Visit (INDEPENDENT_AMBULATORY_CARE_PROVIDER_SITE_OTHER): Payer: BC Managed Care – PPO | Admitting: Family Medicine

## 2019-12-11 ENCOUNTER — Other Ambulatory Visit: Payer: Self-pay

## 2019-12-11 VITALS — BP 126/80 | Ht 63.0 in | Wt 191.0 lb

## 2019-12-11 DIAGNOSIS — Z87898 Personal history of other specified conditions: Secondary | ICD-10-CM | POA: Insufficient documentation

## 2019-12-11 DIAGNOSIS — M62838 Other muscle spasm: Secondary | ICD-10-CM | POA: Diagnosis not present

## 2019-12-11 DIAGNOSIS — S060X0A Concussion without loss of consciousness, initial encounter: Secondary | ICD-10-CM | POA: Insufficient documentation

## 2019-12-11 MED ORDER — TRAZODONE HCL 50 MG PO TABS: 50.0000 mg | ORAL_TABLET | Freq: Every evening | ORAL | 1 refills | Status: DC | PRN

## 2019-12-11 MED ORDER — MECLIZINE HCL 25 MG PO TABS: 25.0000 mg | ORAL_TABLET | Freq: Three times a day (TID) | ORAL | 3 refills | Status: DC | PRN
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
## 2019-12-11 NOTE — Patient Instructions (Addendum)
Thank you for coming in today.  Get plenty of sleep. Ok to work if feeling ok and work is in short burst.  Use trazodone as needed for insomnia.  Use meclezine for vertigo symptoms.  Keep me updated.  Recheck in 1 week.   Please schedule f/u concussion visit (30 min appt) on your way out.  Heat and ibuprofen or tylenol for neck.  Plan for PT soon

## 2019-12-15 ENCOUNTER — Telehealth: Payer: Self-pay | Admitting: Family Medicine

## 2019-12-15 NOTE — Telephone Encounter (Signed)
Called and LM for pt to return call if she would like to move her f/u appt up to earlier this week.

## 2019-12-15 NOTE — Telephone Encounter (Signed)
I can place a referral however they are not can to see you before the 25th.  I recommend that we discussed this during the visit on the 25th.  I could work you in sooner if needed.  Cynthia Lawrence

## 2019-12-15 NOTE — Telephone Encounter (Signed)
Patient called asking if a referral can be sent in for her to neurology? She is still experiencing constant headaches and has noticed flashes of lights when her eyes are closed.  Please advise if this is something Dr. Denyse Amass thinks she would benefit from.

## 2019-12-18 ENCOUNTER — Ambulatory Visit (INDEPENDENT_AMBULATORY_CARE_PROVIDER_SITE_OTHER): Payer: BC Managed Care – PPO

## 2019-12-18 ENCOUNTER — Other Ambulatory Visit: Payer: Self-pay

## 2019-12-18 ENCOUNTER — Ambulatory Visit (INDEPENDENT_AMBULATORY_CARE_PROVIDER_SITE_OTHER): Payer: BC Managed Care – PPO | Admitting: Family Medicine

## 2019-12-18 ENCOUNTER — Encounter: Payer: Self-pay | Admitting: Family Medicine

## 2019-12-18 VITALS — BP 102/68 | HR 76 | Ht 63.0 in | Wt 194.0 lb

## 2019-12-18 DIAGNOSIS — M62838 Other muscle spasm: Secondary | ICD-10-CM

## 2019-12-18 DIAGNOSIS — M47812 Spondylosis without myelopathy or radiculopathy, cervical region: Secondary | ICD-10-CM | POA: Diagnosis not present

## 2019-12-18 DIAGNOSIS — H43392 Other vitreous opacities, left eye: Secondary | ICD-10-CM

## 2019-12-18 DIAGNOSIS — R42 Dizziness and giddiness: Secondary | ICD-10-CM | POA: Diagnosis not present

## 2019-12-18 DIAGNOSIS — S060X0D Concussion without loss of consciousness, subsequent encounter: Secondary | ICD-10-CM

## 2019-12-18 DIAGNOSIS — F458 Other somatoform disorders: Secondary | ICD-10-CM

## 2019-12-18 DIAGNOSIS — F5101 Primary insomnia: Secondary | ICD-10-CM | POA: Diagnosis not present

## 2019-12-18 MED ORDER — CLONAZEPAM 0.5 MG PO TABS: 0.5000 mg | ORAL_TABLET | Freq: Every evening | ORAL | 0 refills | Status: DC | PRN
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
## 2019-12-18 NOTE — Patient Instructions (Addendum)
Thank you for coming in today. Plan for PT for balance and for neck pain.  Use klonopin at bedtime as needed for insomnia.  Ok to advance activity as tolerated.  Recheck in 2-3 weeks.  Return sooner if needed.   Try to get an appointment with your ophthalmologist.  If you cannot get one easily let me know. [facility] here in town usually does a good job getting people in.   Clonazepam tablets What is this medicine? CLONAZEPAM (kloe NA ze pam) is a benzodiazepine. It is used to treat certain types of seizures. It is also used to treat panic disorder. This medicine may be used for other purposes; ask your health care provider or pharmacist if you have questions. COMMON BRAND NAME(S): Ceberclon, Klonopin What should I tell my health care provider before I take this medicine? They need to know if you have any of these conditions:  an alcohol or drug abuse problem  bipolar disorder, depression, psychosis or other mental health condition  glaucoma  kidney or liver disease  lung or breathing disease  myasthenia gravis  Parkinson's disease  porphyria  seizures or a history of seizures  suicidal thoughts  an unusual or allergic reaction to clonazepam, other benzodiazepines, foods, dyes, or preservatives  pregnant or trying to get pregnant  breast-feeding How should I use this medicine? Take this medicine by mouth with a glass of water. Follow the directions on the prescription label. If it upsets your stomach, take it with food or milk. Take your medicine at regular intervals. Do not take it more often than directed. Do not stop taking or change the dose except on the advice of your doctor or health care professional. A special MedGuide will be given to you by the pharmacist with each prescription and refill. Be sure to read this information carefully each time. Talk to your pediatrician regarding the use of this medicine in children. Special care may be needed. Overdosage:  If you think you have taken too much of this medicine contact a poison control center or emergency room at once. NOTE: This medicine is only for you. Do not share this medicine with others. What if I miss a dose? If you miss a dose, take it as soon as you can. If it is almost time for your next dose, take only that dose. Do not take double or extra doses. What may interact with this medicine? Do not take this medication with any of the following medicines:  narcotic medicines for cough  sodium oxybate This medicine may also interact with the following medications:  alcohol  antihistamines for allergy, cough and cold  antiviral medicines for HIV or AIDS  certain medicines for anxiety or sleep  certain medicines for depression, like amitriptyline, fluoxetine, sertraline  certain medicines for fungal infections like ketoconazole and itraconazole  certain medicines for seizures like carbamazepine, phenobarbital, phenytoin, primidone  general anesthetics like halothane, isoflurane, methoxyflurane, propofol  local anesthetics like lidocaine, pramoxine, tetracaine  medicines that relax muscles for surgery  narcotic medicines for pain  phenothiazines like chlorpromazine, mesoridazine, prochlorperazine, thioridazine This list may not describe all possible interactions. Give your health care provider a list of all the medicines, herbs, non-prescription drugs, or dietary supplements you use. Also tell them if you smoke, drink alcohol, or use illegal drugs. Some items may interact with your medicine. What should I watch for while using this medicine? Tell your doctor or health care professional if your symptoms do not start to get  better or if they get worse. Do not stop taking except on your doctor's advice. You may develop a severe reaction. Your doctor will tell you how much medicine to take. You may get drowsy or dizzy. Do not drive, use machinery, or do anything that needs mental  alertness until you know how this medicine affects you. To reduce the risk of dizzy and fainting spells, do not stand or sit up quickly, especially if you are an older patient. Alcohol may increase dizziness and drowsiness. Avoid alcoholic drinks. If you are taking another medicine that also causes drowsiness, you may have more side effects. Give your health care provider a list of all medicines you use. Your doctor will tell you how much medicine to take. Do not take more medicine than directed. Call emergency for help if you have problems breathing or unusual sleepiness. The use of this medicine may increase the chance of suicidal thoughts or actions. Pay special attention to how you are responding while on this medicine. Any worsening of mood, or thoughts of suicide or dying should be reported to your health care professional right away. What side effects may I notice from receiving this medicine? Side effects that you should report to your doctor or health care professional as soon as possible:  allergic reactions like skin rash, itching or hives, swelling of the face, lips, or tongue  breathing problems  confusion  loss of balance or coordination  signs and symptoms of low blood pressure like dizziness; feeling faint or lightheaded, falls; unusually weak or tired  suicidal thoughts or mood changes Side effects that usually do not require medical attention (report to your doctor or health care professional if they continue or are bothersome):  dizziness  headache  tiredness  upset stomach This list may not describe all possible side effects. Call your doctor for medical advice about side effects. You may report side effects to FDA at 1-800-FDA-1088. Where should I keep my medicine? Keep out of the reach of children. This medicine can be abused. Keep your medicine in a safe place to protect it from theft. Do not share this medicine with anyone. Selling or giving away this medicine is  dangerous and against the law. This medicine may cause accidental overdose and death if taken by other adults, children, or pets. Mix any unused medicine with a substance like cat litter or coffee grounds. Then throw the medicine away in a sealed container like a sealed bag or a coffee can with a lid. Do not use the medicine after the expiration date. Store at room temperature between 15 and 30 degrees C (59 and 86 degrees F). Protect from light. Keep container tightly closed. NOTE: This sheet is a summary. It may not cover all possible information. If you have questions about this medicine, talk to your doctor, pharmacist, or health care provider.  2020 Elsevier/Gold Standard (2016-02-18 18:46:32)

## 2019-12-19 ENCOUNTER — Other Ambulatory Visit: Payer: Self-pay | Admitting: Physician Assistant

## 2019-12-19 NOTE — Progress Notes (Signed)
X-ray cervical spine does have some arthritis at C4-5 and C6-7 otherwise it looks largely normal.

## 2019-12-19 NOTE — Telephone Encounter (Signed)
Last apt 05/2019 due back in March  

## 2019-12-25 ENCOUNTER — Telehealth: Payer: Self-pay | Admitting: Physician Assistant

## 2019-12-25 ENCOUNTER — Other Ambulatory Visit: Payer: Self-pay | Admitting: Physician Assistant

## 2019-12-25 MED ORDER — CITALOPRAM HYDROBROMIDE 40 MG PO TABS: 40.0000 mg | ORAL_TABLET | Freq: Every day | ORAL | 0 refills | Status: DC
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
## 2019-12-25 NOTE — Telephone Encounter (Signed)
She did schedule for 04/13 now if its okay she get Adderall. Pharmacy requested

## 2019-12-25 NOTE — Telephone Encounter (Signed)
Patient aware, she's also calling back to set up apt for a counselor.

## 2019-12-25 NOTE — Telephone Encounter (Signed)
Increase Celexa to 40 mg.  I sent in Rx for that, and new Rx for Adderall.

## 2020-01-05 ENCOUNTER — Other Ambulatory Visit: Payer: Self-pay

## 2020-01-05 ENCOUNTER — Ambulatory Visit (INDEPENDENT_AMBULATORY_CARE_PROVIDER_SITE_OTHER): Payer: BC Managed Care – PPO | Admitting: Family Medicine

## 2020-01-05 ENCOUNTER — Encounter: Payer: Self-pay | Admitting: Family Medicine

## 2020-01-05 VITALS — BP 120/80 | HR 89 | Ht 63.0 in | Wt 193.6 lb

## 2020-01-05 DIAGNOSIS — F339 Major depressive disorder, recurrent, unspecified: Secondary | ICD-10-CM | POA: Diagnosis not present

## 2020-01-05 DIAGNOSIS — S060X0D Concussion without loss of consciousness, subsequent encounter: Secondary | ICD-10-CM
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
## 2020-01-05 NOTE — Patient Instructions (Addendum)
Thank you for coming in today.  I think it may be a good idea to add wellbutrin to the celexea or switch to a SNRI medicine like effexor or cymbalta or pristiq.  I think you will get some benefit in drive, motivation and energy symptoms especially with recent concussion.   We will figure out what happened with physical therapy. You should hear soon.   Recheck in 3 weeks.   Let me know if you cannot advance activity.

## 2020-01-06 ENCOUNTER — Ambulatory Visit (INDEPENDENT_AMBULATORY_CARE_PROVIDER_SITE_OTHER): Payer: BC Managed Care – PPO | Admitting: Physician Assistant

## 2020-01-06 ENCOUNTER — Encounter: Payer: Self-pay | Admitting: Physician Assistant

## 2020-01-06 VITALS — BP 118/57 | HR 84

## 2020-01-06 DIAGNOSIS — F331 Major depressive disorder, recurrent, moderate: Secondary | ICD-10-CM

## 2020-01-06 DIAGNOSIS — F4310 Post-traumatic stress disorder, unspecified: Secondary | ICD-10-CM | POA: Diagnosis not present

## 2020-01-06 DIAGNOSIS — F900 Attention-deficit hyperactivity disorder, predominantly inattentive type: Secondary | ICD-10-CM | POA: Diagnosis not present

## 2020-01-06 MED ORDER — AMPHETAMINE-DEXTROAMPHETAMINE 5 MG PO TABS: 1.0000 | ORAL_TABLET | Freq: Two times a day (BID) | ORAL | 0 refills | Status: DC

## 2020-01-06 MED ORDER — CITALOPRAM HYDROBROMIDE 40 MG PO TABS: 40.0000 mg | ORAL_TABLET | Freq: Every day | ORAL | 1 refills | Status: DC

## 2020-01-06 MED ORDER — AMPHETAMINE-DEXTROAMPHETAMINE 5 MG PO TABS: 5.0000 mg | ORAL_TABLET | Freq: Two times a day (BID) | ORAL | 0 refills | Status: DC
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
## 2020-01-08 ENCOUNTER — Telehealth: Payer: Self-pay | Admitting: Family Medicine

## 2020-01-08 NOTE — Telephone Encounter (Signed)
Called and left voicemail with PT phone number to call and schedule

## 2020-01-08 NOTE — Telephone Encounter (Signed)
Pt was advised to all if she did NOT hear from PT. She has not heard from them and the order is from late March per pt.

## 2020-01-15 ENCOUNTER — Ambulatory Visit (INDEPENDENT_AMBULATORY_CARE_PROVIDER_SITE_OTHER): Payer: BC Managed Care – PPO

## 2020-01-15 ENCOUNTER — Ambulatory Visit (INDEPENDENT_AMBULATORY_CARE_PROVIDER_SITE_OTHER): Payer: BC Managed Care – PPO | Admitting: Family Medicine

## 2020-01-15 ENCOUNTER — Other Ambulatory Visit: Payer: Self-pay

## 2020-01-15 ENCOUNTER — Encounter: Payer: Self-pay | Admitting: Family Medicine

## 2020-01-15 VITALS — BP 124/82 | HR 83 | Ht 63.0 in | Wt 192.6 lb

## 2020-01-15 DIAGNOSIS — M5412 Radiculopathy, cervical region: Secondary | ICD-10-CM | POA: Diagnosis not present

## 2020-01-15 DIAGNOSIS — Z87820 Personal history of traumatic brain injury: Secondary | ICD-10-CM | POA: Diagnosis not present

## 2020-01-15 DIAGNOSIS — M62838 Other muscle spasm: Secondary | ICD-10-CM

## 2020-01-15 DIAGNOSIS — R079 Chest pain, unspecified: Secondary | ICD-10-CM

## 2020-01-15 DIAGNOSIS — S060X0D Concussion without loss of consciousness, subsequent encounter: Secondary | ICD-10-CM

## 2020-01-15 MED ORDER — PREDNISONE 50 MG PO TABS: 50.0000 mg | ORAL_TABLET | Freq: Every day | ORAL | 0 refills | Status: DC

## 2020-01-15 MED ORDER — GABAPENTIN 300 MG PO CAPS: 300.0000 mg | ORAL_CAPSULE | Freq: Three times a day (TID) | ORAL | 1 refills | Status: DC | PRN

## 2020-01-15 NOTE — Patient Instructions (Addendum)
Thank you for coming in today. Use a heating pad and TENS unit.  Take prednisone and use gabapentin up to 3x daily however most people will just take it at night.   Let me know if not going well.  Could do PT and muscle relaxer.   Get xray today.   TENS UNIT: This is helpful for muscle pain and spasm.   Search and Purchase a TENS 7000 2nd edition at  www.tenspros.com or www.Amazon.com It should be less than $30.     TENS unit instructions: Do not shower or bathe with the unit on Turn the unit off before removing electrodes or batteries If the electrodes lose stickiness add a drop of water to the electrodes after they are disconnected from the unit and place on plastic sheet. If you continued to have difficulty, call the TENS unit company to purchase more electrodes. Do not apply lotion on the skin area prior to use. Make sure the skin is clean and dry as this will help prolong the life of the electrodes. After use, always check skin for unusual red areas, rash or other skin difficulties. If there are any skin problems, does not apply electrodes to the same area. Never remove the electrodes from the unit by pulling the wires. Do not use the TENS unit or electrodes other than as directed. Do not change electrode placement without consultating your therapist or physician. Keep 2 fingers with between each electrode. Wear time ratio is 2:1, on to off times.    For example on for 30 minutes off for 15 minutes and then on for 30 minutes off for 15 minutes     Cervical Radiculopathy  Cervical radiculopathy happens when a nerve in the neck (a cervical nerve) is pinched or bruised. This condition can happen because of an injury to the cervical spine (vertebrae) in the neck, or as part of the normal aging process. Pressure on the cervical nerves can cause pain or numbness that travels from the neck all the way down into the arm and fingers. Usually, this condition gets better with rest.  Treatment may be needed if the condition does not improve. What are the causes? This condition may be caused by:  A neck injury.  A bulging (herniated) disk.  Muscle spasms.  Muscle tightness in the neck because of overuse.  Arthritis.  Breakdown or degeneration in the bones and joints of the spine (spondylosis) due to aging.  Bone spurs that may develop near the cervical nerves. What are the signs or symptoms? Symptoms of this condition include:  Pain. The pain may travel from the neck to the arm and hand. The pain can be severe or irritating. It may be worse when you move your neck.  Numbness or tingling in your arm or hand.  Weakness in the affected arm and hand, in severe cases. How is this diagnosed? This condition may be diagnosed based on your symptoms, your medical history, and a physical exam. You may also have tests, including:  X-rays.  A CT scan.  An MRI.  An electromyogram (EMG).  Nerve conduction tests. How is this treated? In many cases, treatment is not needed for this condition. With rest, the condition usually gets better over time. If treatment is needed, options may include:  Wearing a soft neck collar (cervical collar) for short periods of time, as told by your health care provider.  Doing physical therapy to strengthen your neck muscles.  Taking medicines, such as NSAIDs or oral  corticosteroids.  Having spinal injections, in severe cases.  Having surgery. This may be needed if other treatments do not help. Different types of surgery may be done depending on the cause of this condition. Follow these instructions at home: If you have a cervical collar:  Wear it as told by your health care provider. Remove it only as told by your health care provider.  Ask your health care provider if you can remove the collar for cleaning and bathing. If you are allowed to remove the collar for cleaning or bathing: ? Follow instructions from your health care  provider about how to remove the collar safely. ? Clean the collar by wiping it with mild soap and water and drying it completely. ? Take out any removable pads in the collar every 1-2 days, and wash them by hand with soap and water. Let them air-dry completely before you put them back in the collar. ? Check your skin under the collar for irritation or sores. If you see any, tell your health care provider. Managing pain      Take over-the-counter and prescription medicines only as told by your health care provider.  If directed, put ice on the affected area. ? If you have a soft neck collar, remove it as told by your health care provider. ? Put ice in a plastic bag. ? Place a towel between your skin and the bag. ? Leave the ice on for 20 minutes, 2-3 times a day.  If applying ice does not help, you can try using heat. Use the heat source that your health care provider recommends, such as a moist heat pack or a heating pad. ? Place a towel between your skin and the heat source. ? Leave the heat on for 20-30 minutes. ? Remove the heat if your skin turns bright red. This is especially important if you are unable to feel pain, heat, or cold. You may have a greater risk of getting burned.  Try a gentle neck and shoulder massage to help relieve symptoms. Activity  Rest as needed.  Return to your normal activities as told by your health care provider. Ask your health care provider what activities are safe for you.  Do stretching and strengthening exercises as told by your health care provider or physical therapist.  Do not lift anything that is heavier than 10 lb (4.5 kg) until your health care provider tells you that it is safe. General instructions  Use a flat pillow when you sleep.  Do not drive while wearing a cervical collar. If you do not have a cervical collar, ask your health care provider if it is safe to drive while your neck heals.  Ask your health care provider if the  medicine prescribed to you requires you to avoid driving or using heavy machinery.  Do not use any products that contain nicotine or tobacco, such as cigarettes, e-cigarettes, and chewing tobacco. These can delay healing. If you need help quitting, ask your health care provider.  Keep all follow-up visits as told by your health care provider. This is important. Contact a health care provider if:  Your condition does not improve with treatment. Get help right away if:  Your pain gets much worse and cannot be controlled with medicines.  You have weakness or numbness in your hand, arm, face, or leg.  You have a high fever.  You have a stiff, rigid neck.  You lose control of your bowels or your bladder (have incontinence).  You have trouble with walking, balance, or speaking. Summary  Cervical radiculopathy happens when a nerve in the neck is pinched or bruised.  A nerve can get pinched from a bulging disk, arthritis, muscle spasms, or an injury to the neck.  Symptoms include pain, tingling, or numbness radiating from the neck into the arm or hand. Weakness can also occur in severe cases.  Treatment may include rest, wearing a cervical collar, and physical therapy. Medicines may be prescribed to help with pain. In severe cases, injections or surgery may be needed. This information is not intended to replace advice given to you by your health care provider. Make sure you discuss any questions you have with your health care provider. Document Revised: 08/02/2018 Document Reviewed: 08/02/2018 Elsevier Patient Education  2020 [employer].

## 2020-01-15 NOTE — Progress Notes (Signed)
I, Christoper Fabian, LAT, ATC, am serving as scribe for Dr. [name].  Cynthia Lawrence is a 49 y.o. female who presents to [facility] today for L-sided upper back pain that woke her up last night.  She denies any MOI.  She was last seen by Dr. Denyse Amass on 01/05/20 for a concussion f/u visit.  Since her last visit, pt reports L mid and upper back pain w/ radiating pain into her L UE w/ some tingling noted.  She states that she had L UE pain about 2-3 days ago but the L-sided back pain started last night.  She is also reporting pressure/squeezing sensation along her ribs.  She notes the pain was a little worse with deep inspiration however that is improving.  She denies trouble breathing cough central pain.  She denies exertional chest pain.  She denies palpitations.  Pain radiates down left arm to forearm.  She denies any pain or symptoms into her hand.   Pertinent review of systems: No fevers or chills  Relevant historical information: PTSD   Exam:  BP 124/82 (BP Location: Right Arm, Patient Position: Sitting, Cuff Size: Normal)   Pulse 83   Ht 5\' 3"  (1.6 m)   Wt 192 lb 9.6 oz (87.4 kg)   SpO2 98%   BMI 34.12 kg/m  General: Well Developed, well nourished, and in no acute distress.   MSK:  C-spine: Normal-appearing Normal cervical motion. Nontender midline.  Tender palpation left trapezius Mildly positive Spurling's test. Upper extremity strength is intact. Sensation is intact throughout upper extremities. Pulses cap refill are intact distally.   Left shoulder: Normal-appearing Tender palpation left trapezius and rhomboid region. Normal shoulder motion. Strength is intact. Negative Hawkins and Neer's test.  Chest: Normal-appearing nontender. Lungs clear to auscultation bilaterally with normal work of breathing.   Lab and Radiology Results DG Cervical Spine Complete  Result Date: 12/18/2019 CLINICAL DATA:  Cervical pain.  Muscle spasms. EXAM:  CERVICAL SPINE - COMPLETE 4+ VIEW COMPARISON:  None. FINDINGS: No fracture, bone lesion or spondylolisthesis. Generalized straightening of the normal cervical lordosis. Mild loss of disc height at C4-C5, C5-C6 and C6-C7 with endplate spurring. Remaining cervical discs are well preserved. Neural foramina are widely patent. Soft tissues are unremarkable. IMPRESSION: 1. No fracture, bone lesion or spondylolisthesis. 2. Mild disc degenerative changes from C4-C5 through C6-C7. Electronically Signed   By: 12/20/2019 M.D.   On: 12/18/2019 15:08   I, 12/20/2019, personally (independently) visualized and performed the interpretation of the images attached in this note.   Chest x-ray images obtained today personally and independently reviewed 2 view chest x-ray: No acute infiltrates.  No significant thoracic spine changes visible. Await formal radiology review    Assessment and Plan: 49 y.o. female with thoracic back pain radiating to left forearm.  This is concerning for T1 radiculopathy.  C5 radiculopathy is also a possibility given similar distribution.  Additionally it is possible that she has trapezius or cervical spasm and dysfunction as well.  Plan for treatment for radiculopathy with prednisone and gabapentin.  If not improving consider muscle relaxer and physical therapy referral.  TENS unit and heating pad recommended today.  Intrathoracic chest etiology much less likely.  Chest x-ray is normal per my read today however radiology overread is pending.  She also denies exertional chest pain or palpitations or significant shortness of breath which is reassuring as well.  Additionally she does not have tachycardia.  Watchful waiting.  Orders Placed This Encounter  Procedures  . DG Chest 2 View    Standing Status:   Future    Number of Occurrences:   1    Standing Expiration Date:   03/16/2021    Order Specific Question:   Reason for Exam (SYMPTOM  OR DIAGNOSIS REQUIRED)    Answer:   eval  pleuritic chest pain    Order Specific Question:   Is patient pregnant?    Answer:   No    Order Specific Question:   Preferred imaging location?    Answer:   [facility]    Order Specific Question:   Radiology Contrast Protocol - do NOT remove file path    Answer:   [email or web address]   Meds ordered this encounter  Medications  . predniSONE (DELTASONE) 50 MG tablet    Sig: Take 1 tablet (50 mg total) by mouth daily.    Dispense:  5 tablet    Refill:  0  . gabapentin (NEURONTIN) 300 MG capsule    Sig: Take 1 capsule (300 mg total) by mouth 3 (three) times daily as needed.    Dispense:  90 capsule    Refill:  1     Discussed warning signs or symptoms. Please see discharge instructions. Patient expresses understanding.   The above documentation has been reviewed and is accurate and complete Lynne Leader

## 2020-01-16 NOTE — Progress Notes (Signed)
Chest x-ray normal per radiology.

## 2020-01-27 ENCOUNTER — Ambulatory Visit (INDEPENDENT_AMBULATORY_CARE_PROVIDER_SITE_OTHER): Payer: BC Managed Care – PPO | Admitting: Family Medicine

## 2020-01-27 ENCOUNTER — Encounter: Payer: Self-pay | Admitting: Family Medicine

## 2020-01-27 ENCOUNTER — Other Ambulatory Visit: Payer: Self-pay

## 2020-01-27 VITALS — BP 114/78 | HR 98 | Ht 63.0 in | Wt 192.0 lb

## 2020-01-27 DIAGNOSIS — S060X0D Concussion without loss of consciousness, subsequent encounter: Secondary | ICD-10-CM | POA: Diagnosis not present

## 2020-01-27 DIAGNOSIS — F4310 Post-traumatic stress disorder, unspecified: Secondary | ICD-10-CM
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
## 2020-01-27 NOTE — Patient Instructions (Addendum)
Thank you for coming in today. This will continue to improve.  OK to advance activity as tolerated.  Check back in 1 month.  Return sooner if needed.   There is therapy for PTSD EMDR.
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
## 2020-02-18 ENCOUNTER — Ambulatory Visit (INDEPENDENT_AMBULATORY_CARE_PROVIDER_SITE_OTHER): Payer: BC Managed Care – PPO | Admitting: Neurology

## 2020-02-18 ENCOUNTER — Encounter: Payer: Self-pay | Admitting: Neurology

## 2020-02-18 ENCOUNTER — Other Ambulatory Visit: Payer: Self-pay

## 2020-02-18 VITALS — BP 130/87 | Ht 62.0 in | Wt 185.0 lb

## 2020-02-18 DIAGNOSIS — G43009 Migraine without aura, not intractable, without status migrainosus: Secondary | ICD-10-CM | POA: Diagnosis not present

## 2020-02-18 DIAGNOSIS — S060X0D Concussion without loss of consciousness, subsequent encounter: Secondary | ICD-10-CM

## 2020-02-18 MED ORDER — ZONISAMIDE 25 MG PO CAPS: ORAL_CAPSULE | ORAL | 0 refills | Status: DC

## 2020-02-18 NOTE — Patient Instructions (Signed)
1.  For preventative management, start zonidamide as directed.  Contact me for refill and I will send prescription for 100mg  capsule 2.  For rescue, Excedrin 3.  Limit use of pain relievers to no more than 2 days out of week to prevent risk of rebound or medication-overuse headache. 4.  Keep headache diary 5.  Exercise, hydration, caffeine cessation, sleep hygiene, monitor for and avoid triggers 6.  Consider:  magnesium citrate 400mg  daily, riboflavin 400mg  daily, and coenzyme Q10 100mg  three times daily 7. Follow up 4 months.   Migraine Headache A migraine headache is a very strong throbbing pain on one side or both sides of your head. This type of headache can also cause other symptoms. It can last from 4 hours to 3 days. Talk with your doctor about what things may bring on (trigger) this condition. What are the causes? The exact cause of this condition is not known. This condition may be triggered or caused by:  Drinking alcohol.  Smoking.  Taking medicines, such as: ? Medicine used to treat chest pain (nitroglycerin). ? Birth control pills. ? Estrogen. ? Some blood pressure medicines.  Eating or drinking certain products.  Doing physical activity. Other things that may trigger a migraine headache include:  Having a menstrual period.  Pregnancy.  Hunger.  Stress.  Not getting enough sleep or getting too much sleep.  Weather changes.  Tiredness (fatigue). What increases the risk?  Being 46-83 years old.  Being female.  Having a family history of migraine headaches.  Being Caucasian.  Having depression or anxiety.  Being very overweight. What are the signs or symptoms?  A throbbing pain. This pain may: ? Happen in any area of the head, such as on one side or both sides. ? Make it hard to do daily activities. ? Get worse with physical activity. ? Get worse around bright lights or loud noises.  Other symptoms may include: ? Feeling sick to your stomach  (nauseous). ? Vomiting. ? Dizziness. ? Being sensitive to bright lights, loud noises, or smells.  Before you get a migraine headache, you may get warning signs (an aura). An aura may include: ? Seeing flashing lights or having blind spots. ? Seeing bright spots, halos, or zigzag lines. ? Having tunnel vision or blurred vision. ? Having numbness or a tingling feeling. ? Having trouble talking. ? Having weak muscles.  Some people have symptoms after a migraine headache (postdromal phase), such as: ? Tiredness. ? Trouble thinking (concentrating). How is this treated?  Taking medicines that: ? Relieve pain. ? Relieve the feeling of being sick to your stomach. ? Prevent migraine headaches.  Treatment may also include: ? Having acupuncture. ? Avoiding foods that bring on migraine headaches. ? Learning ways to control your body functions (biofeedback). ? Therapy to help you know and deal with negative thoughts (cognitive behavioral therapy). Follow these instructions at home: Medicines  Take over-the-counter and prescription medicines only as told by your doctor.  Ask your doctor if the medicine prescribed to you: ? Requires you to avoid driving or using heavy machinery. ? Can cause trouble pooping (constipation). You may need to take these steps to prevent or treat trouble pooping:  Drink enough fluid to keep your pee (urine) pale yellow.  Take over-the-counter or prescription medicines.  Eat foods that are high in fiber. These include beans, whole grains, and fresh fruits and vegetables.  Limit foods that are high in fat and sugar. These include fried or sweet foods. Lifestyle  Do not drink alcohol.  Do not use any products that contain nicotine or tobacco, such as cigarettes, e-cigarettes, and chewing tobacco. If you need help quitting, ask your doctor.  Get at least 8 hours of sleep every night.  Limit and deal with stress. General instructions      Keep a  journal to find out what may bring on your migraine headaches. For example, write down: ? What you eat and drink. ? How much sleep you get. ? Any change in what you eat or drink. ? Any change in your medicines.  If you have a migraine headache: ? Avoid things that make your symptoms worse, such as bright lights. ? It may help to lie down in a dark, quiet room. ? Do not drive or use heavy machinery. ? Ask your doctor what activities are safe for you.  Keep all follow-up visits as told by your doctor. This is important. Contact a doctor if:  You get a migraine headache that is different or worse than others you have had.  You have more than 15 headache days in one month. Get help right away if:  Your migraine headache gets very bad.  Your migraine headache lasts longer than 72 hours.  You have a fever.  You have a stiff neck.  You have trouble seeing.  Your muscles feel weak or like you cannot control them.  You start to lose your balance a lot.  You start to have trouble walking.  You pass out (faint).  You have a seizure. Summary  A migraine headache is a very strong throbbing pain on one side or both sides of your head. These headaches can also cause other symptoms.  This condition may be treated with medicines and changes to your lifestyle.  Keep a journal to find out what may bring on your migraine headaches.  Contact a doctor if you get a migraine headache that is different or worse than others you have had.  Contact your doctor if you have more than 15 headache days in a month. This information is not intended to replace advice given to you by your health care provider. Make sure you discuss any questions you have with your health care provider. Document Revised: 01/03/2019 Document Reviewed: 10/24/2018 Elsevier Patient Education  2020 [employer].

## 2020-02-19 ENCOUNTER — Encounter: Payer: Self-pay | Admitting: Neurology

## 2020-02-26 ENCOUNTER — Ambulatory Visit (INDEPENDENT_AMBULATORY_CARE_PROVIDER_SITE_OTHER): Payer: BC Managed Care – PPO | Admitting: Family Medicine

## 2020-02-26 ENCOUNTER — Other Ambulatory Visit: Payer: Self-pay

## 2020-02-26 ENCOUNTER — Encounter: Payer: Self-pay | Admitting: Family Medicine

## 2020-02-26 VITALS — BP 142/98 | HR 88 | Ht 62.0 in | Wt 188.8 lb

## 2020-02-26 DIAGNOSIS — M62838 Other muscle spasm: Secondary | ICD-10-CM | POA: Diagnosis not present

## 2020-02-26 DIAGNOSIS — S060X0D Concussion without loss of consciousness, subsequent encounter: Secondary | ICD-10-CM
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
## 2020-02-26 NOTE — Patient Instructions (Addendum)
Thank you for coming in today. Plan for PT.  Recommend you talk with your Psychiatry PA about your visits.  If not improved let me know.  If the fatigue persists look at sleep.  Quality and Time.  Could benefit from a workup typically with PCP (iron, thyroid etc) and eventually a sleep study.   Keep me updated.

## 2020-03-03 ENCOUNTER — Ambulatory Visit (INDEPENDENT_AMBULATORY_CARE_PROVIDER_SITE_OTHER): Payer: BC Managed Care – PPO | Admitting: Rehabilitative and Restorative Service Providers"

## 2020-03-03 ENCOUNTER — Other Ambulatory Visit: Payer: Self-pay

## 2020-03-03 ENCOUNTER — Encounter: Payer: Self-pay | Admitting: Rehabilitative and Restorative Service Providers"

## 2020-03-03 DIAGNOSIS — M542 Cervicalgia: Secondary | ICD-10-CM | POA: Diagnosis not present

## 2020-03-03 DIAGNOSIS — M436 Torticollis: Secondary | ICD-10-CM

## 2020-03-03 DIAGNOSIS — R293 Abnormal posture: Secondary | ICD-10-CM | POA: Diagnosis not present
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
## 2020-03-03 NOTE — Patient Instructions (Signed)
Access Code: [email or web address] URL: [email or web address] Date: 03/03/2020 Prepared by: Pauletta Browns  Exercises Standing Scapular Retraction - 5 x daily - 7 x weekly - 1 sets - 5 reps - 5 hold Standing Isometric Cervical Sidebending with Manual Resistance - 5 x daily - 7 x weekly - 1 sets - 3 reps - 5 hold Standing Isometric Cervical Extension with Manual Resistance - 5 x daily - 7 x weekly - 1 sets - 5 reps - 5 hold

## 2020-03-04 ENCOUNTER — Encounter: Payer: BC Managed Care – PPO | Admitting: Physical Therapy

## 2020-03-16 ENCOUNTER — Other Ambulatory Visit: Payer: Self-pay

## 2020-03-16 ENCOUNTER — Ambulatory Visit (INDEPENDENT_AMBULATORY_CARE_PROVIDER_SITE_OTHER): Payer: BC Managed Care – PPO | Admitting: Rehabilitative and Restorative Service Providers"

## 2020-03-16 ENCOUNTER — Encounter: Payer: Self-pay | Admitting: Rehabilitative and Restorative Service Providers"

## 2020-03-16 DIAGNOSIS — M436 Torticollis: Secondary | ICD-10-CM

## 2020-03-16 DIAGNOSIS — R293 Abnormal posture: Secondary | ICD-10-CM

## 2020-03-16 DIAGNOSIS — M542 Cervicalgia: Secondary | ICD-10-CM

## 2020-03-16 NOTE — Therapy (Signed)
[facility] [address] [place], [place], [address] Phone: [phone]   Fax:  [phone]  Physical Therapy Treatment  Patient Details  Name: Cynthia Lawrence MRN: [id number] Date of Birth: [birth year] Referring Provider (PT): [name] MD   Encounter Date: 03/16/2020   PT End of Session - 03/16/20 1647    Visit Number 2    Number of Visits 16    Date for PT Re-Evaluation 05/28/20    PT Start Time 1015    PT Stop Time 1100    PT Time Calculation (min) 45 min    Activity Tolerance Patient tolerated treatment well;No increased pain    Behavior During Therapy WFL for tasks assessed/performed           Past Medical History:  Diagnosis Date  . Allergy   . Anxiety   . Asthma   . Chronic tonsillitis   . Depression   . Seasonal allergies     Past Surgical History:  Procedure Laterality Date  . ENDOMETRIAL ABLATION    . EYE SURGERY     lasik  . TONSILLECTOMY Bilateral 08/15/2016   Procedure: TONSILLECTOMY;  Surgeon: Drema Halon, MD;  Location: [facility];  Service: ENT;  Laterality: Bilateral;  . WISDOM TOOTH EXTRACTION      There were no vitals filed for this visit.   Subjective Assessment - 03/16/20 1015    Subjective Cynthia Lawrence reports good HEP compliance at the beach over the weekend.    Pertinent History Previous trampoline injury with cervical radiculopathy.    Limitations Sitting;Lifting;Reading;Other (comment)   Driving   How long can you sit comfortably? < 30 minutes    How long can you stand comfortably? Unknown    How long can you walk comfortably? 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    Currently in Pain? Yes    Pain Score 4     Pain Location Neck    Pain Descriptors / Indicators Tightness    Pain Type Chronic pain    Pain Radiating Towards R > L UE paresthesias    Pain Onset More than a month ago    Pain Frequency Constant    Aggravating  Factors  Sitting, computer work, driving    Pain Relieving Factors Ice and swimming    Effect of Pain on Daily Activities Pain at the end of the day                             [facility] Adult PT Treatment/Exercise - 03/16/20 0001      Therapeutic Activites    Therapeutic Activities Other Therapeutic Activities   Swimming mechanics and appropriate/inappropriate workouts     Exercises   Exercises Shoulder;Neck      Neck Exercises: Machines for Strengthening   UBE (Upper Arm Bike) Pull only :30 work and :30 rest 8 minutes level 4.5    Cybex Row 45# 2 sets of 15    Other Machines for Strengthening 5# shoulder ER 2 sets of 10 with slow eccentrics Bilateral    Other Machines for Strengthening Triceps/Shoulder extensions palm up 2 sets of 10 5#      Neck Exercises: Standing   Other Standing Exercises Cervical Isometrics Extension and lateral bending 2 sets of 5 for 5 seconds each direction    Other Standing Exercises Scapular retraction/shoulder blade pinches 2 sets of 10 for  5 seconds      Neck Exercises: Prone   Other Prone Exercise Prone 90 & 100 next visit                  PT Education - 03/16/20 1646    Education Details Reviewed HEP.    Person(s) Educated Patient    Methods Explanation;Demonstration;Tactile cues;Verbal cues    Comprehension Verbalized understanding;Tactile cues required;Returned demonstration;Need further instruction;Verbal cues required            PT Short Term Goals - 03/16/20 1646      PT SHORT TERM GOAL #1   Title Cynthia Lawrence will be independent with her starter HEP.    Baseline No HEP.    Time 2    Period Weeks    Status Achieved    Target Date 03/17/20             PT Long Term Goals - 03/03/20 1313      PT LONG TERM GOAL #1   Title Cynthia Lawrence will report cervical pain consistently 0-2/10 on the Numeric Pain Rating Scale with no radiculopathy.    Baseline Can be 5-6/10 with R sided peripheral pain to the fingers.    Time 8     Period Weeks    Status New    Target Date 04/30/20      PT LONG TERM GOAL #2   Title Cynthia Lawrence will be able to complete a full work day uninterrupted without having to stop or adjust her schedule due to pain.    Baseline Pain limits participation at work and with household ADLs.    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    Baseline 45 degrees.    Time 8    Period Weeks    Status New    Target Date 04/30/20      PT LONG TERM GOAL #4   Title Improve cervical strength as assessed by MMT and subjective self-report for return to full function.    Baseline 3/5 strength limits endurance with work and ADLs.    Time 8    Period Weeks    Status New    Target Date 04/30/20      PT LONG TERM GOAL #5   Title Cynthia Lawrence will be independent with her DC HEP.    Time 8    Period Weeks    Status New    Target Date 04/30/20                 Plan - 03/16/20 1648    Clinical Impression Statement Maebelle reports good compliance with her HEP while out of town.  She also admits to participating in some activities that may be irritating for her neck (swimming).  We reviewed appropriate strokes (freestyle or backstroke with head in neutral) and to avoid rounded shoulder/forward head type postures.    Examination-Activity Limitations Sit;Other   Driving.   Examination-Participation Restrictions Interpersonal Relationship;Driving;Other   Work as a Buyer, retail Stable/Uncomplicated    Rehab Potential Good    PT Frequency 2x / week    PT Duration 8 weeks    PT Treatment/Interventions ADLs/Self Care Home Management;Cryotherapy;Traction;Therapeutic activities;Therapeutic exercise;Neuromuscular re-education;Patient/family education;Manual techniques;Dry  needling    PT Next Visit Plan  Focus on cervical and scapular strength along with posture and body mechanics.    PT Home Exercise Plan Access Code: [email or web address]    Consulted and Agree with Plan of Care Patient           Patient will benefit from skilled therapeutic intervention in order to improve the following deficits and impairments:  Decreased activity tolerance, Decreased range of motion, Decreased mobility, Decreased strength, Improper body mechanics, Postural dysfunction, Pain, Obesity  Visit Diagnosis: Cervicalgia  Abnormal posture  Stiffness of cervical spine     Problem List Patient Active Problem List   Diagnosis Date Noted  . History of vertigo 12/11/2019  . Concussion without loss of consciousness 12/11/2019  . MDD (major depressive disorder) 08/11/2018  . PTSD (post-traumatic stress disorder) 08/11/2018  . Night terrors 08/11/2018  . Bruxism 08/11/2018  . Tick bite 05/02/2018  . Abnormal urinalysis 12/28/2017  . Dysuria 12/28/2017  . Healthcare maintenance 07/04/2017  . Depression, recurrent ([place]) 07/04/2017    Farley Ly PT, MPT 03/16/2020, 4:52 PM  [facility] [address] [place], [place], [address] Phone: [phone]   Fax:  [phone]  Name: Sebastiana Wuest MRN: [id number] Date of Birth: 01/22/1971

## 2020-03-18 ENCOUNTER — Other Ambulatory Visit: Payer: Self-pay

## 2020-03-18 ENCOUNTER — Ambulatory Visit (INDEPENDENT_AMBULATORY_CARE_PROVIDER_SITE_OTHER): Payer: BC Managed Care – PPO | Admitting: Rehabilitative and Restorative Service Providers"

## 2020-03-18 ENCOUNTER — Encounter: Payer: Self-pay | Admitting: Rehabilitative and Restorative Service Providers"

## 2020-03-18 DIAGNOSIS — M542 Cervicalgia: Secondary | ICD-10-CM

## 2020-03-18 DIAGNOSIS — R293 Abnormal posture: Secondary | ICD-10-CM

## 2020-03-18 DIAGNOSIS — M436 Torticollis: Secondary | ICD-10-CM

## 2020-03-18 NOTE — Therapy (Signed)
[facility] [address] [place], [place], [address] Phone: [phone]   Fax:  [phone]  Physical Therapy Treatment  Patient Details  Name: Cynthia Lawrence MRN: [id number] Date of Birth: Jun 12, 1971 Referring Provider (PT): [name] MD   Encounter Date: 03/18/2020   PT End of Session - 03/18/20 1742    Visit Number 3    Number of Visits 16    Date for PT Re-Evaluation 05/28/20    PT Start Time 1015    PT Stop Time 1100    PT Time Calculation (min) 45 min    Activity Tolerance Patient tolerated treatment well;No increased pain    Behavior During Therapy WFL for tasks assessed/performed           Past Medical History:  Diagnosis Date  . Allergy   . Anxiety   . Asthma   . Chronic tonsillitis   . Depression   . Seasonal allergies     Past Surgical History:  Procedure Laterality Date  . ENDOMETRIAL ABLATION    . EYE SURGERY     lasik  . TONSILLECTOMY Bilateral 08/15/2016   Procedure: TONSILLECTOMY;  Surgeon: Drema Halon, MD;  [facility];  Service: ENT;  Laterality: Bilateral;  . WISDOM TOOTH EXTRACTION      There were no vitals filed for this visit.   Subjective Assessment - 03/18/20 1738    Subjective Arryanna reports feeling better after her last visit.  Prolonged sitting in the car is certainly not helping her progress and she drives thousands of miles/week with her job.    Pertinent History Previous trampoline injury with cervical radiculopathy.    Limitations Sitting;Lifting;Reading;Other (comment)   Driving   How long can you sit comfortably? < 30 minutes    How long can you stand comfortably? Unknown    How long can you walk comfortably? 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    Currently in Pain? Yes    Pain Score 4     Pain Location Neck    Pain Descriptors / Indicators Tightness    Pain Type Chronic pain    Pain Radiating  Towards R > L UE paresthesias    Pain Onset More than a month ago    Pain Frequency Constant    Aggravating Factors  Driving and sitting    Pain Relieving Factors Exercises    Effect of Pain on Daily Activities Limited comfortable sitting endurance                             [facility] Adult PT Treatment/Exercise - 03/18/20 0001      Exercises   Exercises Shoulder;Neck      Neck Exercises: Machines for Strengthening   UBE (Upper Arm Bike) Pull only :30 work and :30 rest 8 minutes level 4.5    Cybex Row 55# 2 sets of 20    Other Machines for Strengthening 5# shoulder ER 2 sets of 10 with slow eccentrics Bilateral    Other Machines for Strengthening Triceps/Shoulder extensions palm up 2 sets of 10 5#      Neck Exercises: Standing   Other Standing Exercises Cervical Isometrics Extension and lateral bending 2 sets of 5 for 5 seconds each direction    Other Standing Exercises Scapular retraction/shoulder blade pinches 2 sets of 10 for 5 seconds      Neck Exercises:  Prone   Other Prone Exercise Prone 90 degrees thumbs up with legs up 2 sets of 5  3 seconds    Other Prone Exercise Prone alternating arm and leg extensions head in neutral 2 sets of 10  3 seconds      Shoulder Exercises: ROM/Strengthening   UBE (Upper Arm Bike) Pull only Resistance 4.5 8 minutes :15 pull/:15 rest    Other ROM/Strengthening Exercises Theraband ER with 5# 2 sets of 10                  PT Education - 03/18/20 1741    Education Details Review HEP    Person(s) Educated Patient    Methods Explanation;Demonstration;Verbal cues;Handout    Comprehension Verbalized understanding;Returned demonstration;Need further instruction;Verbal cues required            PT Short Term Goals - 03/16/20 1646      PT SHORT TERM GOAL #1   Title Perris will be independent with her starter HEP.    Baseline No HEP.    Time 2    Period Weeks    Status Achieved    Target Date 03/17/20              PT Long Term Goals - 03/03/20 1313      PT LONG TERM GOAL #1   Title Jenaye will report cervical pain consistently 0-2/10 on the Numeric Pain Rating Scale with no radiculopathy.    Baseline Can be 5-6/10 with R sided peripheral pain to the fingers.    Time 8    Period Weeks    Status New    Target Date 04/30/20      PT LONG TERM GOAL #2   Title Shanika will be able to complete a full work day uninterrupted without having to stop or adjust her schedule due to pain.    Baseline Pain limits participation at work and with household ADLs.    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    Baseline 45 degrees.    Time 8    Period Weeks    Status New    Target Date 04/30/20      PT LONG TERM GOAL #4   Title Improve cervical strength as assessed by MMT and subjective self-report for return to full function.    Baseline 3/5 strength limits endurance with work and ADLs.    Time 8    Period Weeks    Status New    Target Date 04/30/20      PT LONG TERM GOAL #5   Title Monnica will be independent with her DC HEP.    Time 8    Period Weeks    Status New    Target Date 04/30/20                 Plan - 03/18/20 1742    Clinical Impression Statement Prolonged sitting in the car is certainly not helping Anglea's progress and she drives thousands of miles/week with her job.  However, she notes feeling better after her strength work wth PT.  Continued strengthening and body mechanics work should allow Dijon to sit and drive for longer periods of time without pain.  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    Examination-Activity Limitations Sit;Other   Driving.   Examination-Participation Restrictions Interpersonal Relationship;Driving;Other   Work as a Nurse, mental health Stable/Uncomplicated    Rehab Potential Good    PT Frequency 2x / week    PT Duration 8 weeks    PT Treatment/Interventions ADLs/Self Care Home Management;Cryotherapy;Traction;Therapeutic activities;Therapeutic exercise;Neuromuscular re-education;Patient/family education;Manual techniques;Dry needling    PT Next Visit Plan Focus on cervical and scapular strength along with posture and body mechanics.    PT Home Exercise Plan Access Code: [email or web address]    Consulted and Agree with Plan of Care Patient           Patient will benefit from skilled therapeutic intervention in order to improve the following deficits and impairments:  Decreased activity tolerance, Decreased range of motion, Decreased mobility, Decreased strength, Improper body mechanics, Postural dysfunction, Pain, Obesity  Visit Diagnosis: Cervicalgia  Abnormal posture  Stiffness of cervical spine     Problem List Patient Active Problem List   Diagnosis Date Noted  . History of vertigo 12/11/2019  . Concussion without loss of consciousness 12/11/2019  . MDD (major depressive disorder) 08/11/2018  . PTSD (post-traumatic stress disorder) 08/11/2018  . Night terrors 08/11/2018  . Bruxism 08/11/2018  . Tick bite 05/02/2018  . Abnormal urinalysis 12/28/2017  . Dysuria 12/28/2017  . Healthcare maintenance 07/04/2017  . Depression, recurrent ([facility]) 07/04/2017    Cherlyn Cushing PT, MPT 03/18/2020, 5:44 PM  [facility] Physical Therapy [address] [place], [place], [address] Phone: [phone]   Fax:  [phone]  Name: Loretta Doutt MRN: [id number] Date of Birth: 08-21-1971

## 2020-03-18 NOTE — Patient Instructions (Signed)
Access Code: [email or web address] URL: [email or web address] Date: 03/18/2020 Prepared by: Pauletta Browns  Exercises Prone Alternating Arm and Leg Lifts - 1 x daily - 7 x weekly - 2 sets - 10 reps - 3 seconds hold Prone Scapular Retraction Arms at Side - 1 x daily - 7 x weekly - 2 sets - 5 reps

## 2020-03-22 ENCOUNTER — Other Ambulatory Visit: Payer: Self-pay

## 2020-03-22 ENCOUNTER — Encounter: Payer: Self-pay | Admitting: Rehabilitative and Restorative Service Providers"

## 2020-03-22 ENCOUNTER — Ambulatory Visit (INDEPENDENT_AMBULATORY_CARE_PROVIDER_SITE_OTHER): Payer: BC Managed Care – PPO | Admitting: Rehabilitative and Restorative Service Providers"

## 2020-03-22 DIAGNOSIS — M542 Cervicalgia: Secondary | ICD-10-CM

## 2020-03-22 DIAGNOSIS — R293 Abnormal posture: Secondary | ICD-10-CM | POA: Diagnosis not present

## 2020-03-22 DIAGNOSIS — M436 Torticollis: Secondary | ICD-10-CM | POA: Diagnosis not present

## 2020-03-22 NOTE — Therapy (Addendum)
[facility] [address] [place], [place], [address] Phone: [phone]   Fax:  [phone]  Physical Therapy Treatment/Discharge  Patient Details  Name: Cynthia Lawrence MRN: [id number] Date of Birth: 1971-01-17 Referring Provider (PT): Lynne Leader MD   Encounter Date: 03/22/2020   PT End of Session - 03/22/20 1206    Visit Number 4    Number of Visits 16    Date for PT Re-Evaluation 05/28/20    PT Start Time 7615    PT Stop Time 1059    PT Time Calculation (min) 44 min    Activity Tolerance Patient tolerated treatment well;No increased pain    Behavior During Therapy WFL for tasks assessed/performed           Past Medical History:  Diagnosis Date   Allergy    Anxiety    Asthma    Chronic tonsillitis    Depression    Seasonal allergies     Past Surgical History:  Procedure Laterality Date   ENDOMETRIAL ABLATION     EYE SURGERY     lasik   TONSILLECTOMY Bilateral 08/15/2016   Procedure: TONSILLECTOMY;  Surgeon: Rozetta Nunnery, MD;  Location: [facility];  Service: ENT;  Laterality: Bilateral;   WISDOM TOOTH EXTRACTION      There were no vitals filed for this visit.   Subjective Assessment - 03/22/20 1050    Subjective Cynthia Lawrence notes less pain and less paresthesias over the weekend.    Pertinent History Previous trampoline injury with cervical radiculopathy.    Limitations Sitting;Lifting;Reading;Other (comment)   Driving   How long can you sit comfortably? < 30 minutes    How long can you stand comfortably? Unknown    How long can you walk comfortably? 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    Pain Score 2     Pain Location Shoulder    Pain Descriptors / Indicators Dull;Tightness    Pain Type Chronic pain    Pain Radiating Towards Improving R and L UE pain/paresthesias    Pain Onset More than a month ago    Pain Frequency Intermittent     Aggravating Factors  Sitting and driving    Pain Relieving Factors Exercises    Effect of Pain on Daily Activities Limited endurance with seated activities                             [facility] Adult PT Treatment/Exercise - 03/22/20 0001      Exercises   Exercises Shoulder;Neck      Neck Exercises: Machines for Strengthening   UBE (Upper Arm Bike) Pull only :30 work and :30 rest 8 minutes level 4.5    Cybex Row 55# 2 sets of 20    Other Machines for Strengthening 5# shoulder ER 2 sets of 10 with slow eccentrics Bilateral    Other Machines for Strengthening Triceps/Shoulder extensions palm up 2 sets of 10 5#      Neck Exercises: Standing   Other Standing Exercises Cervical Isometrics Extension and lateral bending 2 sets of 5 for 5 seconds each direction    Other Standing Exercises Scapular retraction/shoulder blade pinches 2 sets of 10 for 5 seconds      Neck Exercises: Prone   Other Prone Exercise Prone 90 degrees thumbs up with legs up 2 sets of 10  3 seconds  Other Prone Exercise Prone alternating arm and leg extensions head in neutral 2 sets of 10  3 seconds      Shoulder Exercises: ROM/Strengthening   UBE (Upper Arm Bike) Pull only Resistance 5 8 minutes :15 pull/:15 rest    Other ROM/Strengthening Exercises Theraband ER with 5# 2 sets of 10                  PT Education - 03/22/20 1053    Education Details Reviewed HEP.  Added reps to prone strengthening and resistance to UBE.    Person(s) Educated Patient    Methods Explanation;Verbal cues    Comprehension Verbalized understanding;Returned demonstration;Verbal cues required;Need further instruction            PT Short Term Goals - 03/16/20 1646      PT SHORT TERM GOAL #1   Title Cynthia Lawrence will be independent with her starter HEP.    Baseline No HEP.    Time 2    Period Weeks    Status Achieved    Target Date 03/17/20             PT Long Term Goals - 03/22/20 1204      PT LONG TERM  GOAL #1   Title Cynthia Lawrence will report cervical pain consistently 0-2/10 on the Numeric Pain Rating Scale with no radiculopathy.    Baseline 0-3/10 over the weekend (Can be 5-6/10 with R sided peripheral pain to the fingers).    Time 8    Period Weeks    Status On-going      PT LONG TERM GOAL #2   Title Cynthia Lawrence will be able to complete a full work day uninterrupted without having to stop or adjust her schedule due to pain.    Baseline Endurance is improving but pain limits participation at work and with household ADLs.    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    Baseline 60 degrees (was 45 degrees).    Time 8    Period Weeks    Status Achieved      PT LONG TERM GOAL #4   Title Improve cervical strength as assessed by MMT and subjective self-report for return to full function.    Baseline 3+/5 (was 3/5) strength limits endurance with work and ADLs.    Time 8    Period Weeks    Status On-going      PT LONG TERM GOAL #5   Title Cynthia Lawrence will be independent with her DC HEP.    Time 8    Period Weeks    Status New                 Plan - 03/22/20 1206    Clinical Impression Statement Cynthia Lawrence notes less pain and paresthesias in her R arm over the weekend.  The L arm was pain-free.  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    Advertising account planner;Other   Driving.   Examination-Participation Restrictions Interpersonal Relationship;Driving;Other   Work as a Metallurgist Stable/Uncomplicated    Rehab Potential Good  PT Frequency 2x / week    PT Duration 8 weeks    PT Treatment/Interventions ADLs/Self Care Home  Management;Cryotherapy;Traction;Therapeutic activities;Therapeutic exercise;Neuromuscular re-education;Patient/family education;Manual techniques;Dry needling    PT Next Visit Plan Focus on cervical and scapular strength along with posture and body mechanics.    PT Home Exercise Plan Access Code: [email or web address]    Consulted and Agree with Plan of Care Patient           Patient will benefit from skilled therapeutic intervention in order to improve the following deficits and impairments:  Decreased activity tolerance, Decreased range of motion, Decreased mobility, Decreased strength, Improper body mechanics, Postural dysfunction, Pain, Obesity  Visit Diagnosis: Cervicalgia  Abnormal posture  Stiffness of cervical spine     Problem List Patient Active Problem List   Diagnosis Date Noted   History of vertigo 12/11/2019   Concussion without loss of consciousness 12/11/2019   MDD (major depressive disorder) 08/11/2018   PTSD (post-traumatic stress disorder) 08/11/2018   Night terrors 08/11/2018   Bruxism 08/11/2018   Tick bite 05/02/2018   Abnormal urinalysis 12/28/2017   Dysuria 12/28/2017   Healthcare maintenance 07/04/2017   Depression, recurrent ([place]) 07/04/2017    Farley Ly PT, MPT 03/22/2020, 12:09 PM   PHYSICAL THERAPY DISCHARGE SUMMARY  Visits from Start of Care: 4  Current functional level related to goals / functional outcomes: See note   Remaining deficits: See note   Education / Equipment: HEP Plan: Patient agrees to discharge.  Patient goals were partially met. Patient is being discharged due to not returning since the last visit.  ?????    Farley Ly PT, MPT  [facility] [address] [place], [place], [address] Phone: [phone]   Fax:  [phone]  Name: Cynthia Lawrence MRN: [id number] Date of Birth: [birth year]

## 2020-03-23 DIAGNOSIS — Z113 Encounter for screening for infections with a predominantly sexual mode of transmission: Secondary | ICD-10-CM | POA: Diagnosis not present

## 2020-03-23 DIAGNOSIS — N898 Other specified noninflammatory disorders of vagina: Secondary | ICD-10-CM | POA: Diagnosis not present

## 2020-03-24 ENCOUNTER — Encounter: Payer: BC Managed Care – PPO | Admitting: Rehabilitative and Restorative Service Providers"

## 2020-03-30 ENCOUNTER — Encounter: Payer: BC Managed Care – PPO | Admitting: Rehabilitative and Restorative Service Providers"

## 2020-04-01 ENCOUNTER — Encounter: Payer: BC Managed Care – PPO | Admitting: Rehabilitative and Restorative Service Providers"

## 2020-04-05 ENCOUNTER — Encounter: Payer: Self-pay | Admitting: Physician Assistant

## 2020-04-05 ENCOUNTER — Other Ambulatory Visit: Payer: Self-pay

## 2020-04-05 ENCOUNTER — Ambulatory Visit (INDEPENDENT_AMBULATORY_CARE_PROVIDER_SITE_OTHER): Payer: BC Managed Care – PPO | Admitting: Physician Assistant

## 2020-04-05 DIAGNOSIS — F3341 Major depressive disorder, recurrent, in partial remission: Secondary | ICD-10-CM

## 2020-04-05 DIAGNOSIS — F900 Attention-deficit hyperactivity disorder, predominantly inattentive type: Secondary | ICD-10-CM | POA: Diagnosis not present

## 2020-04-05 DIAGNOSIS — F4310 Post-traumatic stress disorder, unspecified: Secondary | ICD-10-CM | POA: Diagnosis not present
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
## 2020-04-06 ENCOUNTER — Encounter: Payer: BC Managed Care – PPO | Admitting: Rehabilitative and Restorative Service Providers"

## 2020-04-08 ENCOUNTER — Encounter: Payer: BC Managed Care – PPO | Admitting: Rehabilitative and Restorative Service Providers"

## 2020-05-11 ENCOUNTER — Other Ambulatory Visit: Payer: Self-pay

## 2020-05-11 ENCOUNTER — Encounter: Payer: Self-pay | Admitting: Family Medicine

## 2020-05-11 ENCOUNTER — Ambulatory Visit (INDEPENDENT_AMBULATORY_CARE_PROVIDER_SITE_OTHER): Payer: BC Managed Care – PPO | Admitting: Family Medicine

## 2020-05-11 VITALS — BP 132/78 | HR 81 | Ht 62.0 in | Wt 186.6 lb

## 2020-05-11 DIAGNOSIS — G4489 Other headache syndrome: Secondary | ICD-10-CM | POA: Diagnosis not present

## 2020-05-11 DIAGNOSIS — F688 Other specified disorders of adult personality and behavior: Secondary | ICD-10-CM | POA: Diagnosis not present

## 2020-05-11 DIAGNOSIS — F0781 Postconcussional syndrome: Secondary | ICD-10-CM

## 2020-05-11 DIAGNOSIS — F308 Other manic episodes: Secondary | ICD-10-CM | POA: Diagnosis not present

## 2020-05-11 MED ORDER — BUPROPION HCL ER (XL) 150 MG PO TB24: 150.0000 mg | ORAL_TABLET | ORAL | 0 refills | Status: DC

## 2020-05-11 NOTE — Patient Instructions (Addendum)
Thank you for coming in today. I think we should get you back to psychiatry.   I am concerned about you possibly having bipolar and that is affecting the concussion.   Plan for MRI. Plan to restart Wellbutrin.   Follow up with Ms Alto Denver.  Recheck with me in 1 month.   Let me know if this is not working.
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
## 2020-05-12 ENCOUNTER — Ambulatory Visit: Payer: BC Managed Care – PPO | Admitting: Family Medicine

## 2020-05-17 ENCOUNTER — Other Ambulatory Visit: Payer: Self-pay

## 2020-05-17 ENCOUNTER — Ambulatory Visit (INDEPENDENT_AMBULATORY_CARE_PROVIDER_SITE_OTHER): Payer: BC Managed Care – PPO

## 2020-05-17 DIAGNOSIS — G4489 Other headache syndrome: Secondary | ICD-10-CM | POA: Diagnosis not present
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
## 2020-05-18 DIAGNOSIS — N3946 Mixed incontinence: Secondary | ICD-10-CM | POA: Diagnosis not present

## 2020-05-18 DIAGNOSIS — R339 Retention of urine, unspecified: Secondary | ICD-10-CM | POA: Diagnosis not present

## 2020-05-18 DIAGNOSIS — N390 Urinary tract infection, site not specified: Secondary | ICD-10-CM | POA: Diagnosis not present

## 2020-05-18 DIAGNOSIS — R3915 Urgency of urination: Secondary | ICD-10-CM | POA: Diagnosis not present

## 2020-06-05 ENCOUNTER — Other Ambulatory Visit: Payer: Self-pay

## 2020-06-05 ENCOUNTER — Encounter (HOSPITAL_BASED_OUTPATIENT_CLINIC_OR_DEPARTMENT_OTHER): Payer: Self-pay

## 2020-06-05 ENCOUNTER — Emergency Department (HOSPITAL_BASED_OUTPATIENT_CLINIC_OR_DEPARTMENT_OTHER): Payer: BC Managed Care – PPO

## 2020-06-05 ENCOUNTER — Emergency Department (HOSPITAL_BASED_OUTPATIENT_CLINIC_OR_DEPARTMENT_OTHER)
Admission: EM | Admit: 2020-06-05 | Discharge: 2020-06-05 | Disposition: A | Discharge status: Home / Self Care | Payer: BC Managed Care – PPO | Attending: Emergency Medicine | Admitting: Emergency Medicine

## 2020-06-05 DIAGNOSIS — U071 COVID-19: Secondary | ICD-10-CM | POA: Insufficient documentation

## 2020-06-05 DIAGNOSIS — J1282 Pneumonia due to coronavirus disease 2019: Secondary | ICD-10-CM | POA: Diagnosis not present

## 2020-06-05 DIAGNOSIS — J45909 Unspecified asthma, uncomplicated: Secondary | ICD-10-CM | POA: Insufficient documentation

## 2020-06-05 DIAGNOSIS — J189 Pneumonia, unspecified organism: Secondary | ICD-10-CM | POA: Diagnosis not present

## 2020-06-05 DIAGNOSIS — R197 Diarrhea, unspecified: Secondary | ICD-10-CM | POA: Diagnosis not present

## 2020-06-05 DIAGNOSIS — Z79899 Other long term (current) drug therapy: Secondary | ICD-10-CM | POA: Insufficient documentation

## 2020-06-05 DIAGNOSIS — R519 Headache, unspecified: Secondary | ICD-10-CM | POA: Diagnosis not present

## 2020-06-05 DIAGNOSIS — R0602 Shortness of breath: Secondary | ICD-10-CM | POA: Diagnosis not present

## 2020-06-05 LAB — CBC
HCT: 37.2 % (ref 36.0–46.0)
Hemoglobin: 12.4 g/dL (ref 12.0–15.0)
MCH: 29.7 pg (ref 26.0–34.0)
MCHC: 33.3 g/dL (ref 30.0–36.0)
MCV: 89 fL (ref 80.0–100.0)
Platelets: 168 10*3/uL (ref 150–400)
RBC: 4.18 MIL/uL (ref 3.87–5.11)
RDW: 13.9 % (ref 11.5–15.5)
WBC: 3.9 10*3/uL — ABNORMAL LOW (ref 4.0–10.5)
nRBC: 0 % (ref 0.0–0.2)

## 2020-06-05 LAB — BASIC METABOLIC PANEL
Anion gap: 9 (ref 5–15)
BUN: <5 mg/dL — ABNORMAL LOW (ref 6–20)
CO2: 20 mmol/L — ABNORMAL LOW (ref 22–32)
Calcium: 8.3 mg/dL — ABNORMAL LOW (ref 8.9–10.3)
Chloride: 108 mmol/L (ref 98–111)
Creatinine, Ser: 0.64 mg/dL (ref 0.44–1.00)
GFR calc Af Amer: >60 mL/min (ref >60)
GFR calc non Af Amer: >60 mL/min (ref >60)
Glucose, Bld: 108 mg/dL — ABNORMAL HIGH (ref 70–99)
Potassium: 3.3 mmol/L — ABNORMAL LOW (ref 3.5–5.1)
Sodium: 137 mmol/L (ref 135–145)

## 2020-06-05 LAB — TROPONIN I (HIGH SENSITIVITY): Troponin I (High Sensitivity): 3 ng/L (ref <18)

## 2020-06-05 MED ORDER — AZITHROMYCIN 250 MG PO TABS: 250.0000 mg | ORAL_TABLET | Freq: Every day | ORAL | 0 refills | Status: DC

## 2020-06-05 MED ORDER — PREDNISONE 50 MG PO TABS: 60.0000 mg | ORAL_TABLET | Freq: Once | ORAL | Status: DC

## 2020-06-05 MED ORDER — ALBUTEROL SULFATE HFA 108 (90 BASE) MCG/ACT IN AERS
2.0000 | INHALATION_SPRAY | Freq: Once | RESPIRATORY_TRACT | Status: AC
  Administered 2020-06-05: 2 via RESPIRATORY_TRACT
  Filled 2020-06-05: qty 6.7

## 2020-06-05 MED ORDER — PREDNISONE 20 MG PO TABS: ORAL_TABLET | ORAL | 0 refills | Status: DC

## 2020-06-05 NOTE — ED Notes (Signed)
Entered room to give pt inhaler that was ordered. Pt wanted to go to the restroom. Informed pt that we could get her a bed side commode. Pt stated that she was not going on the bedside and she was going to leave the room and go to the restroom. Inhaler given and pt left the room to go to the lobby so she could use the restroom. Informed Charge RN and RN taking care of that Pt.

## 2020-06-05 NOTE — ED Provider Notes (Signed)
[facility] EMERGENCY DEPARTMENT Provider Note   CSN: [id number] Arrival date & time: 06/05/20  1802     History Chief Complaint  Patient presents with  . Chest Pain    COVID+    Cynthia Lawrence is a 49 y.o. female history of depression presenting with COVID and shortness of breath.  Patient states that she tested +9 days ago.  She has been staying at home.  She has been having nonproductive cough.  She states that she has worsening chest pain when she coughs.  She also has some headaches as well.  She denies any fevers at home.  She did not receive the Covid vaccine.  The history is provided by the patient.       Past Medical History:  Diagnosis Date  . Allergy   . Anxiety   . Asthma   . Chronic tonsillitis   . Depression   . Seasonal allergies     Patient Active Problem List   Diagnosis Date Noted  . History of vertigo 12/11/2019  . Concussion without loss of consciousness 12/11/2019  . MDD (major depressive disorder) 08/11/2018  . PTSD (post-traumatic stress disorder) 08/11/2018  . Night terrors 08/11/2018  . Bruxism 08/11/2018  . Tick bite 05/02/2018  . Abnormal urinalysis 12/28/2017  . Dysuria 12/28/2017  . Healthcare maintenance 07/04/2017  . Depression, recurrent (HCC) 07/04/2017    Past Surgical History:  Procedure Laterality Date  . ENDOMETRIAL ABLATION    . EYE SURGERY     lasik  . TONSILLECTOMY Bilateral 08/15/2016   Procedure: TONSILLECTOMY;  Surgeon: Drema Halon, MD;  Location: [facility];  Service: ENT;  Laterality: Bilateral;  . WISDOM TOOTH EXTRACTION       OB History   No obstetric history on file.     Family History  Problem Relation Age of Onset  . Diabetes Mother   . Stroke Mother   . Diabetes Father   . Healthy Sister   . Healthy Brother   . Healthy Daughter   . Healthy Son   . Healthy Sister   . Healthy Daughter   . Healthy Daughter     Social History   Tobacco Use  . Smoking status:  Never Smoker  . Smokeless tobacco: Never Used  Vaping Use  . Vaping Use: Never used  Substance Use Topics  . Alcohol use: Yes    Alcohol/week: 0.0 - 1.0 standard drinks    Comment: occ  . Drug use: No    Home Medications Prior to Admission medications   Medication Sig Start Date End Date Taking? Authorizing Provider  amphetamine-dextroamphetamine (ADDERALL) 5 MG tablet Take 1 tablet (5 mg total) by mouth 2 (two) times daily with breakfast and lunch. 03/22/20   Melony Overly T, PA-C  buPROPion (WELLBUTRIN XL) 150 MG 24 hr tablet Take 1 tablet (150 mg total) by mouth every morning. 05/11/20   Rodolph Bong, MD  cetirizine (ZYRTEC) 10 MG tablet Take 10 mg by mouth daily.    [provider]  citalopram (CELEXA) 40 MG tablet Take 1 tablet (40 mg total) by mouth daily. 01/06/20   Melony Overly T, PA-C  fluticasone (FLONASE) 50 MCG/ACT nasal spray Place into both nostrils daily.    [provider]  Nutritional Supplements (JUICE PLUS FIBRE PO) Take by mouth daily.    [provider]  vitamin B-12 (CYANOCOBALAMIN) 100 MCG tablet Take 100 mcg by mouth daily.    [provider]  Allergies    Patient has no known allergies.  Review of Systems   Review of Systems  Respiratory: Positive for cough.   Cardiovascular: Positive for chest pain.  All other systems reviewed and are negative.   Physical Exam Updated Vital Signs BP 129/82 (BP Location: Right Arm)   Pulse 81   Temp 98.8 F (37.1 C) (Oral)   Resp 18   Ht 5\' 3"  (1.6 m)   Wt 83.9 kg   SpO2 95%   BMI 32.77 kg/m   Physical Exam Vitals and nursing note reviewed.  Constitutional:      Comments: Slightly uncomfortable  HENT:     Head: Normocephalic.  Eyes:     Pupils: Pupils are equal, round, and reactive to light.  Cardiovascular:     Rate and Rhythm: Normal rate and regular rhythm.     Heart sounds: Normal heart sounds.  Pulmonary:     Comments: Slightly tachypneic and dry crackles  bilateral bases.  No retractions. Abdominal:     General: Bowel sounds are normal.     Palpations: Abdomen is soft.  Musculoskeletal:        General: Normal range of motion.  Skin:    General: Skin is warm.     Capillary Refill: Capillary refill takes less than 2 seconds.  Neurological:     General: No focal deficit present.     Mental Status: She is oriented to person, place, and time.  Psychiatric:        Mood and Affect: Mood normal.        Behavior: Behavior normal.     ED Results / Procedures / Treatments   Labs (all labs ordered are listed, but only abnormal results are displayed) Labs Reviewed  BASIC METABOLIC PANEL - Abnormal; Notable for the following components:      Result Value   Potassium 3.3 (*)    CO2 20 (*)    Glucose, Bld 108 (*)    BUN <5 (*)    Calcium 8.3 (*)    All other components within normal limits  CBC - Abnormal; Notable for the following components:   WBC 3.9 (*)    All other components within normal limits  TROPONIN I (HIGH SENSITIVITY)  TROPONIN I (HIGH SENSITIVITY)    EKG EKG Interpretation  Date/Time:  Saturday June 05 2020 18:41:44 EDT Ventricular Rate:  64 PR Interval:  134 QRS Duration: 78 QT Interval:  408 QTC Calculation: 420 R Axis:   49 Text Interpretation: Normal sinus rhythm Normal ECG No previous ECGs available Confirmed by 08-01-1990 [phone]) on 06/05/2020 8:52:41 PM   Radiology DG Chest Portable 1 View  Result Date: 06/05/2020 CLINICAL DATA:  Greater than 1 week history of cough, headache and diarrhea. COVID-19 positive. EXAM: PORTABLE CHEST 1 VIEW COMPARISON:  01/15/2020 and earlier. FINDINGS: Cardiomediastinal silhouette unremarkable and unchanged. Patchy ground-glass airspace opacities in the lung bases and in the RIGHT mid lung. No confluent airspace consolidation. No pleural effusions. IMPRESSION: Patchy ground-glass pneumonia in the lung bases and in the RIGHT mid lung as is seen in patients with viral  (COVID-19) pneumonia. Electronically Signed   By: 01/17/2020 M.D.   On: 06/05/2020 19:05    Procedures Procedures (including critical care time)  Medications Ordered in ED Medications  albuterol (VENTOLIN HFA) 108 (90 Base) MCG/ACT inhaler 2 puff (has no administration in time range)  predniSONE (DELTASONE) tablet 60 mg (has no administration in time range)    ED Course  I have reviewed the triage vital signs and the nursing notes.  Pertinent labs & imaging results that were available during my care of the patient were reviewed by me and considered in my medical decision making (see chart for details).    MDM Rules/Calculators/A&P                         Eleanna Theilen is a 49 y.o. female here presenting with cough and shortness of breath.  I think likely from Covid.  Is not hypoxic.  Will ambulate patient and get cbc, bmp, and chest x-ray.  9:16 PM Patient decided to leave before we checked ambulatory sat.  Patient did not have any shortness of breath when she was ambulating.  Her chest x-ray showed possible pneumonia but her white blood cell count is slightly low.  We will give her a course of azithromycin, prednisone, albuterol as needed.   Final Clinical Impression(s) / ED Diagnoses Final diagnoses:  None    Rx / DC Orders ED Discharge Orders    None       Charlynne Pander, MD 06/05/20 2117

## 2020-06-05 NOTE — ED Notes (Signed)
Pt ambulated around room on room air. SATS remained 98% HR 95

## 2020-06-05 NOTE — ED Notes (Signed)
SpO2 95% on r/a in lobby, HR 77, RR 18.

## 2020-06-05 NOTE — ED Triage Notes (Signed)
Pt arrives with reports of positive home covid test 9 days ago states that she has been getting worse and having increased pain in her chest.

## 2020-06-05 NOTE — ED Notes (Signed)
Pt not assessed by this RN prior to her departure. Pt left facility initially before being formally discharged. Pt then returned to room for her prescriptions but was in a hurry to leave and reluctant to even have VS rechecked.

## 2020-06-05 NOTE — Discharge Instructions (Signed)
Take prednisone as prescribed.  You have symptoms consistent with Covid and your chest x-ray showed possible pneumonia.  Take azithromycin as prescribed.  Take Tylenol or Motrin for fevers.  Stay hydrated.  Use albuterol every 4 hours.  See your doctor for follow-up.    Return to ER if you have worsening shortness of breath, trouble breathing, fever.

## 2020-06-06 ENCOUNTER — Telehealth (HOSPITAL_COMMUNITY): Payer: Self-pay | Admitting: Nurse Practitioner

## 2020-06-06 DIAGNOSIS — U071 COVID-19: Secondary | ICD-10-CM | POA: Insufficient documentation

## 2020-06-06 NOTE — Telephone Encounter (Signed)
Called to Discuss with patient about Covid symptoms and the use of regeneron, a monoclonal antibody infusion for those with mild to moderate Covid symptoms and at a high risk of hospitalization.     Pt is qualified for this infusion at the [facility] due to co-morbid conditions and/or a member of an at-risk group.     Unable to reach pt. Left message to return call. Sent mychart message.   Murel Wigle, DNP, AGNP-C [phone] (Infusion Center Hotline)  

## 2020-06-07 ENCOUNTER — Ambulatory Visit: Payer: BC Managed Care – PPO | Admitting: Family Medicine

## 2020-06-07 ENCOUNTER — Telehealth: Payer: Self-pay | Admitting: Internal Medicine

## 2020-06-07 NOTE — Telephone Encounter (Signed)
Called to Discuss with patient about Covid symptoms and the use of the monoclonal antibody infusion for those with mild to moderate Covid symptoms and at a high risk of hospitalization.     Pt appears to qualify for this infusion due to co-morbid conditions and/or a member of an at-risk group in accordance with the FDA Emergency Use Authorization. BMI >25 and with hx of asthma. Need to determine symptom onset.    Unable to reach pt. LVM  Marcy Salvo, NP [facility]

## 2020-06-08 ENCOUNTER — Other Ambulatory Visit (HOSPITAL_COMMUNITY): Payer: Self-pay | Admitting: Nurse Practitioner

## 2020-06-08 ENCOUNTER — Encounter: Payer: Self-pay | Admitting: Nurse Practitioner

## 2020-06-08 DIAGNOSIS — U071 COVID-19: Secondary | ICD-10-CM

## 2020-06-08 NOTE — Progress Notes (Signed)
I connected by phone with Cynthia Lawrence on 06/08/2020 at 9:49 AM to discuss the potential use of an new treatment for mild to moderate COVID-19 viral infection in non-hospitalized patients.  This patient is a 49 y.o. female that meets the FDA criteria for Emergency Use Authorization of casirivimab\imdevimab.  Has a (+) direct SARS-CoV-2 viral test result  Has mild or moderate COVID-19   Is ? 47 years of age and weighs ? 40 kg  Is NOT hospitalized due to COVID-19  Is NOT requiring oxygen therapy or requiring an increase in baseline oxygen flow rate due to COVID-19  Is within 10 days of symptom onset  Has at least one of the high risk factor(s) for progression to severe COVID-19 and/or hospitalization as defined in EUA.  Specific high risk criteria : BMI > 25 and Chronic Lung Disease   Sx onset 9/7. Unvaccinated.    I have spoken and communicated the following to the patient or parent/caregiver:  1. FDA has authorized the emergency use of casirivimab\imdevimab for the treatment of mild to moderate COVID-19 in adults and pediatric patients with positive results of direct SARS-CoV-2 viral testing who are 11 years of age and older weighing at least 40 kg, and who are at high risk for progressing to severe COVID-19 and/or hospitalization.  2. The significant known and potential risks and benefits of casirivimab\imdevimab, and the extent to which such potential risks and benefits are unknown.  3. Information on available alternative treatments and the risks and benefits of those alternatives, including clinical trials.  4. Patients treated with casirivimab\imdevimab should continue to self-isolate and use infection control measures (e.g., wear mask, isolate, social distance, avoid sharing personal items, clean and disinfect "high touch" surfaces, and frequent handwashing) according to CDC guidelines.   5. The patient or parent/caregiver has the option to accept or refuse  casirivimab\imdevimab .  After reviewing this information with the patient, The patient agreed to proceed with receiving casirivimab\imdevimab infusion and will be provided a copy of the Fact sheet prior to receiving the infusion.Consuello Masse, DNP, AGNP-C [phone] (Infusion Center Hotline)

## 2020-06-09 ENCOUNTER — Other Ambulatory Visit (HOSPITAL_COMMUNITY): Payer: Self-pay

## 2020-06-09 ENCOUNTER — Ambulatory Visit (HOSPITAL_COMMUNITY)
Admission: RE | Admit: 2020-06-09 | Discharge: 2020-06-09 | Disposition: A | Discharge status: Home / Self Care | Payer: BC Managed Care – PPO | Source: Ambulatory Visit | Attending: Pulmonary Disease | Admitting: Pulmonary Disease

## 2020-06-09 DIAGNOSIS — U071 COVID-19: Secondary | ICD-10-CM

## 2020-06-09 MED ORDER — SODIUM CHLORIDE 0.9 % IV SOLN
1200.0000 mg | Freq: Once | INTRAVENOUS | Status: AC
  Administered 2020-06-09: 1200 mg via INTRAVENOUS
  Filled 2020-06-09: qty 10

## 2020-06-09 MED ORDER — SODIUM CHLORIDE 0.9 % IV SOLN: INTRAVENOUS | Status: DC | PRN

## 2020-06-09 MED ORDER — FAMOTIDINE IN NACL 20-0.9 MG/50ML-% IV SOLN: 20.0000 mg | Freq: Once | INTRAVENOUS | Status: DC | PRN

## 2020-06-09 MED ORDER — METHYLPREDNISOLONE SODIUM SUCC 125 MG IJ SOLR: 125.0000 mg | Freq: Once | INTRAMUSCULAR | Status: DC | PRN

## 2020-06-09 MED ORDER — EPINEPHRINE 0.3 MG/0.3ML IJ SOAJ: 0.3000 mg | Freq: Once | INTRAMUSCULAR | Status: DC | PRN

## 2020-06-09 MED ORDER — DIPHENHYDRAMINE HCL 50 MG/ML IJ SOLN: 50.0000 mg | Freq: Once | INTRAMUSCULAR | Status: DC | PRN

## 2020-06-09 MED ORDER — ALBUTEROL SULFATE HFA 108 (90 BASE) MCG/ACT IN AERS: 2.0000 | INHALATION_SPRAY | Freq: Once | RESPIRATORY_TRACT | Status: DC | PRN

## 2020-06-09 NOTE — Discharge Instructions (Signed)

## 2020-06-09 NOTE — Progress Notes (Signed)
  Diagnosis: COVID-19  Physician: Wright, MD  Procedure: Covid Infusion Clinic Med: casirivimab\imdevimab infusion - Provided patient with casirivimab\imdevimab fact sheet for patients, parents and caregivers prior to infusion.  Complications: No immediate complications noted.  Discharge: Discharged home   Allia Wiltsey R Mykalah Saari 06/09/2020   

## 2020-06-22 DIAGNOSIS — M9901 Segmental and somatic dysfunction of cervical region: Secondary | ICD-10-CM | POA: Diagnosis not present

## 2020-06-22 DIAGNOSIS — M9903 Segmental and somatic dysfunction of lumbar region: Secondary | ICD-10-CM | POA: Diagnosis not present

## 2020-06-22 DIAGNOSIS — M9904 Segmental and somatic dysfunction of sacral region: Secondary | ICD-10-CM | POA: Diagnosis not present

## 2020-06-22 DIAGNOSIS — M9902 Segmental and somatic dysfunction of thoracic region: Secondary | ICD-10-CM | POA: Diagnosis not present

## 2020-06-23 ENCOUNTER — Other Ambulatory Visit: Payer: Self-pay | Admitting: Physician Assistant

## 2020-06-24 NOTE — Telephone Encounter (Signed)
Next apt 09/2020 

## 2020-06-29 DIAGNOSIS — M9901 Segmental and somatic dysfunction of cervical region: Secondary | ICD-10-CM | POA: Diagnosis not present

## 2020-06-29 DIAGNOSIS — M9904 Segmental and somatic dysfunction of sacral region: Secondary | ICD-10-CM | POA: Diagnosis not present

## 2020-06-29 DIAGNOSIS — M9902 Segmental and somatic dysfunction of thoracic region: Secondary | ICD-10-CM | POA: Diagnosis not present

## 2020-06-29 DIAGNOSIS — M9903 Segmental and somatic dysfunction of lumbar region: Secondary | ICD-10-CM | POA: Diagnosis not present
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
## 2020-07-01 ENCOUNTER — Ambulatory Visit: Payer: BC Managed Care – PPO | Admitting: Neurology

## 2020-07-09 DIAGNOSIS — M9903 Segmental and somatic dysfunction of lumbar region: Secondary | ICD-10-CM | POA: Diagnosis not present

## 2020-07-09 DIAGNOSIS — M9904 Segmental and somatic dysfunction of sacral region: Secondary | ICD-10-CM | POA: Diagnosis not present

## 2020-07-09 DIAGNOSIS — M9901 Segmental and somatic dysfunction of cervical region: Secondary | ICD-10-CM | POA: Diagnosis not present

## 2020-07-09 DIAGNOSIS — M9902 Segmental and somatic dysfunction of thoracic region: Secondary | ICD-10-CM | POA: Diagnosis not present

## 2020-07-24 ENCOUNTER — Other Ambulatory Visit: Payer: Self-pay | Admitting: Physician Assistant

## 2020-07-26 NOTE — Telephone Encounter (Signed)
Next apt 09/2020 

## 2020-07-28 ENCOUNTER — Telehealth: Payer: Self-pay | Admitting: Physician Assistant

## 2020-07-28 ENCOUNTER — Telehealth: Payer: Self-pay

## 2020-07-28 NOTE — Telephone Encounter (Signed)
[facility], Dr. Geri [place], [phone]. I don't know him but he was recommended as an option from another therapist. Hypnotists are hard to find.

## 2020-07-28 NOTE — Telephone Encounter (Signed)
Pt would like to know if you can recommend a hypnotherapist to her? This was recommended by the [facility] clinic in [place].

## 2020-07-28 NOTE — Telephone Encounter (Signed)
Patient given information from previous phone message that she requested from [facility]

## 2020-08-10 ENCOUNTER — Ambulatory Visit (INDEPENDENT_AMBULATORY_CARE_PROVIDER_SITE_OTHER): Payer: BC Managed Care – PPO | Admitting: Family Medicine

## 2020-08-10 ENCOUNTER — Ambulatory Visit: Payer: Self-pay

## 2020-08-10 ENCOUNTER — Other Ambulatory Visit: Payer: Self-pay

## 2020-08-10 VITALS — BP 128/80 | HR 90 | Ht 63.0 in | Wt 192.8 lb

## 2020-08-10 DIAGNOSIS — M25511 Pain in right shoulder: Secondary | ICD-10-CM

## 2020-08-10 DIAGNOSIS — M6740 Ganglion, unspecified site: Secondary | ICD-10-CM

## 2020-08-10 NOTE — Patient Instructions (Addendum)
Thank you for coming in today.  The hand bump is a large ganglion cyst.  If it comes back it can be surgically removed.   Pt should help the neck/shoudler pain.   Recheck back in 6 weeks.   Let me know if you have trouble getting into PT.   Call or go to the ER if you develop a large red swollen joint with extreme pain or oozing puss.    Ganglion Cyst  A ganglion cyst is a non-cancerous, fluid-filled lump that occurs near a joint or tendon. The cyst grows out of a joint or the lining of a tendon. Ganglion cysts most often develop in the hand or wrist, but they can also develop in the shoulder, elbow, hip, knee, ankle, or foot. Ganglion cysts are ball-shaped or egg-shaped. Their size can range from the size of a pea to larger than a grape. Increased activity may cause the cyst to get bigger because more fluid starts to build up. What are the causes? The exact cause of this condition is not known, but it may be related to:  Inflammation or irritation around the joint.  An injury.  Repetitive movements or overuse.  Arthritis. What increases the risk? You are more likely to develop this condition if:  You are a woman.  You are 70-68 years old. What are the signs or symptoms? The main symptom of this condition is a lump. It most often appears on the hand or wrist. In many cases, there are no other symptoms, but a cyst can sometimes cause:  Tingling.  Pain.  Numbness.  Muscle weakness.  Weak grip.  Less range of motion in a joint. How is this diagnosed? Ganglion cysts are usually diagnosed based on a physical exam. Your health care provider will feel the lump and may shine a light next to it. If it is a ganglion cyst, the light will likely shine through it. Your health care provider may order an X-ray, ultrasound, or MRI to rule out other conditions. How is this treated? Ganglion cysts often go away on their own without treatment. If you have pain or other symptoms,  treatment may be needed. Treatment is also needed if the ganglion cyst limits your movement or if it gets infected. Treatment may include:  Wearing a brace or splint on your wrist or finger.  Taking anti-inflammatory medicine.  Having fluid drained from the lump with a needle (aspiration).  Getting a steroid injected into the joint.  Having surgery to remove the ganglion cyst.  Placing a pad on your shoe or wearing shoes that will not rub against the cyst if it is on your foot. Follow these instructions at home:  Do not press on the ganglion cyst, poke it with a needle, or hit it.  Take over-the-counter and prescription medicines only as told by your health care provider.  If you have a brace or splint: ? Wear it as told by your health care provider. ? Remove it as told by your health care provider. Ask if you need to remove it when you take a shower or a bath.  Watch your ganglion cyst for any changes.  Keep all follow-up visits as told by your health care provider. This is important. Contact a health care provider if:  Your ganglion cyst becomes larger or more painful.  You have pus coming from the lump.  You have weakness or numbness in the affected area.  You have a fever or chills. Get help right  away if:  You have a fever and have any of these in the cyst area: ? Increased redness. ? Red streaks. ? Swelling. Summary  A ganglion cyst is a non-cancerous, fluid-filled lump that occurs near a joint or tendon.  Ganglion cysts most often develop in the hand or wrist, but they can also develop in the shoulder, elbow, hip, knee, ankle, or foot.  Ganglion cysts often go away on their own without treatment. This information is not intended to replace advice given to you by your health care provider. Make sure you discuss any questions you have with your health care provider. Document Revised: 08/24/2017 Document Reviewed: 05/11/2017 Elsevier Patient Education  2020  [employer].
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
## 2020-08-14 DIAGNOSIS — F411 Generalized anxiety disorder: Secondary | ICD-10-CM | POA: Diagnosis not present

## 2020-08-16 ENCOUNTER — Ambulatory Visit: Payer: BC Managed Care – PPO | Admitting: Physical Therapy

## 2020-08-23 ENCOUNTER — Other Ambulatory Visit: Payer: Self-pay

## 2020-08-23 ENCOUNTER — Ambulatory Visit: Payer: BC Managed Care – PPO | Attending: Family Medicine | Admitting: Physical Therapy

## 2020-08-23 ENCOUNTER — Encounter: Payer: Self-pay | Admitting: Physical Therapy

## 2020-08-23 DIAGNOSIS — R293 Abnormal posture: Secondary | ICD-10-CM | POA: Diagnosis not present

## 2020-08-23 DIAGNOSIS — M542 Cervicalgia: Secondary | ICD-10-CM | POA: Insufficient documentation

## 2020-08-23 DIAGNOSIS — R252 Cramp and spasm: Secondary | ICD-10-CM | POA: Insufficient documentation

## 2020-08-23 DIAGNOSIS — M25511 Pain in right shoulder: Secondary | ICD-10-CM | POA: Diagnosis not present
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
## 2020-08-23 NOTE — Patient Instructions (Signed)
Trigger Point Dry Needling   What is Trigger Point Dry Needling (DN)? o DN is a physical therapy technique used to treat muscle pain and dysfunction. Specifically, DN helps deactivate muscle trigger points (muscle knots).  o A thin filiform needle is used to penetrate the skin and stimulate the underlying trigger point. The goal is for a local twitch response (LTR) to occur and for the trigger point to relax. No medication of any kind is injected during the procedure.    What Does Trigger Point Dry Needling Feel Like?  o The procedure feels different for each individual patient. Some patients report that they do not actually feel the needle enter the skin and overall the process is not painful. Very mild bleeding may occur. However, many patients feel a deep cramping in the muscle in which the needle was inserted. This is the local twitch response.    How Will I feel after the treatment? o Soreness is normal, and the onset of soreness may not occur for a few hours. Typically this soreness does not last longer than two days.  o Bruising is uncommon, however; ice can be used to decrease any possible bruising.  o In rare cases feeling tired or nauseous after the treatment is normal. In addition, your symptoms may get worse before they get better, this period will typically not last longer than 24 hours.    What Can I do After My Treatment? o Increase your hydration by drinking more water for the next 24 hours. o You may place ice or heat on the areas treated that have become sore, however, do not use heat on inflamed or bruised areas. Heat often brings more relief post needling. o You can continue your regular activities, but vigorous activity is not recommended initially after the treatment for 24 hours. o DN is best combined with other physical therapy such as strengthening, stretching, and other therapies.

## 2020-08-24 DIAGNOSIS — F411 Generalized anxiety disorder: Secondary | ICD-10-CM | POA: Diagnosis not present

## 2020-08-25 ENCOUNTER — Ambulatory Visit: Payer: BC Managed Care – PPO | Attending: Family Medicine | Admitting: Physical Therapy

## 2020-08-25 ENCOUNTER — Other Ambulatory Visit: Payer: Self-pay

## 2020-08-25 ENCOUNTER — Encounter: Payer: Self-pay | Admitting: Physical Therapy

## 2020-08-25 DIAGNOSIS — M25511 Pain in right shoulder: Secondary | ICD-10-CM | POA: Diagnosis not present

## 2020-08-25 DIAGNOSIS — R252 Cramp and spasm: Secondary | ICD-10-CM | POA: Insufficient documentation

## 2020-08-25 DIAGNOSIS — M436 Torticollis: Secondary | ICD-10-CM | POA: Diagnosis not present

## 2020-08-25 DIAGNOSIS — M542 Cervicalgia: Secondary | ICD-10-CM | POA: Diagnosis not present

## 2020-08-25 DIAGNOSIS — R293 Abnormal posture: Secondary | ICD-10-CM | POA: Diagnosis not present

## 2020-08-25 NOTE — Patient Instructions (Signed)

## 2020-08-25 NOTE — Therapy (Signed)
[facility] [address] [place], [place], [address] Phone: [phone]   Fax:  [phone]  Physical Therapy Treatment  Patient Details  Name: Izel Hochberg MRN: [id number] Date of Birth: 1971-07-09 Referring Provider (PT): Cheron Every Date: 08/25/2020   PT End of Session - 08/25/20 1417    Visit Number 2    Date for PT Re-Evaluation 10/23/20    Authorization Type BCBS    PT Start Time 1345    PT Stop Time 1430    PT Time Calculation (min) 45 min    Activity Tolerance Patient tolerated treatment well    Behavior During Therapy [facility] for tasks assessed/performed           Past Medical History:  Diagnosis Date  . Allergy   . Anxiety   . Asthma   . Chronic tonsillitis   . Depression   . Seasonal allergies     Past Surgical History:  Procedure Laterality Date  . ENDOMETRIAL ABLATION    . EYE SURGERY     lasik  . TONSILLECTOMY Bilateral 08/15/2016   Procedure: TONSILLECTOMY;  Surgeon: Drema Halon, MD;  Location: [facility];  Service: ENT;  Laterality: Bilateral;  . WISDOM TOOTH EXTRACTION      There were no vitals filed for this visit.   Subjective Assessment - 08/25/20 1346    Subjective Lower end of the pain spectrum today.    Currently in Pain? Yes    Pain Score 3     Pain Location Shoulder    Pain Orientation Right                             [facility] Adult PT Treatment/Exercise - 08/25/20 0001      Exercises   Exercises Neck;Shoulder      Neck Exercises: Standing   Other Standing Exercises Bilat Should Flex, Ext, IR up back 2ld dowell x15     Other Standing Exercises ER red 2x10       Neck Exercises: Seated   Other Seated Exercise Rows & Lats 25lb 2x10      Modalities   Modalities Electrical Stimulation      Electrical Stimulation   Electrical Stimulation Location Upper R trap     Electrical Stimulation Action IFC    Electrical Stimulation  Parameters Supine to pt tolerance    Electrical Stimulation Goals Pain      Manual Therapy   Manual Therapy Soft tissue mobilization    Soft tissue mobilization right upper trap and rhomboid area            Trigger Point Dry Needling - 08/25/20 0001    Consent Given? Yes    Education Handout Provided Yes    Muscles Treated Head and Neck Upper trapezius    Upper Trapezius Response Twitch reponse elicited;Palpable increased muscle length                  PT Short Term Goals - 08/23/20 1105      PT SHORT TERM GOAL #1   Title Junita will be independent with her starter HEP.    Time 2    Period Weeks    Status New             PT Long Term Goals - 08/23/20 1105      PT LONG TERM GOAL #1   Title Kerri will report cervical pain  consistently 0-2/10 on the Numeric Pain Rating Scale with no radiculopathy.    Time 8    Period Weeks    Status New      PT LONG TERM GOAL #2   Title Angeli will be able to complete a full work day uninterrupted without having to stop or adjust her schedule due to pain.    Time 8    Status New      PT LONG TERM GOAL #3   Title increase right shoulder IR to WNL's    Time 8    Period Weeks    Status New      PT LONG TERM GOAL #4   Title report right shoulder pain decreased 50% overall    Time 8    Period Weeks    Status New      PT LONG TERM GOAL #5   Title Amaryah will be independent with her DC HEP.    Time 8    Period Weeks    Status New                 Plan - 08/25/20 1419    Clinical Impression Statement No aerobic warm up due to pt not wanting to wear mask due to hr asthma. Trigger point noted in upper R trap with palpation. No reports of increase pain reported with the interventions. Tactile cues for posture needed with seated rows and lats. Ces needed to squeeze shoulder blades together with shoulder external rotation    Stability/Clinical Decision Making Stable/Uncomplicated    Rehab Potential Good    PT  Frequency 2x / week    PT Duration 8 weeks    PT Treatment/Interventions ADLs/Self Care Home Management;Cryotherapy;Electrical Stimulation;Iontophoresis 4mg /ml Dexamethasone;Moist Heat;Ultrasound;Traction;Therapeutic exercise;Therapeutic activities;Neuromuscular re-education;Manual techniques;Patient/family education;Dry needling    PT Next Visit Plan could try massage, dry needling, strength, she does not like any aerobic with a mask on           Patient will benefit from skilled therapeutic intervention in order to improve the following deficits and impairments:  Decreased range of motion, Increased muscle spasms, Pain, Improper body mechanics, Decreased strength, Postural dysfunction  Visit Diagnosis: Cervicalgia  Acute pain of right shoulder  Abnormal posture     Problem List Patient Active Problem List   Diagnosis Date Noted  . COVID-19 virus infection 06/06/2020  . History of vertigo 12/11/2019  . Concussion without loss of consciousness 12/11/2019  . MDD (major depressive disorder) 08/11/2018  . PTSD (post-traumatic stress disorder) 08/11/2018  . Night terrors 08/11/2018  . Bruxism 08/11/2018  . Tick bite 05/02/2018  . Abnormal urinalysis 12/28/2017  . Dysuria 12/28/2017  . Healthcare maintenance 07/04/2017  . Depression, recurrent (HCC) 07/04/2017    09/03/2017, PTA 08/25/2020, 2:25 PM  [facility] [address] [place], [place], [place] Phone: [phone]   Fax:  [phone]  Name: Stephanye Finnicum MRN: Okey Regal Date of Birth: 06-03-1971

## 2020-08-28 DIAGNOSIS — F411 Generalized anxiety disorder: Secondary | ICD-10-CM | POA: Diagnosis not present

## 2020-08-30 ENCOUNTER — Encounter: Payer: Self-pay | Admitting: Physical Therapy

## 2020-08-30 ENCOUNTER — Other Ambulatory Visit: Payer: Self-pay

## 2020-08-30 ENCOUNTER — Ambulatory Visit: Payer: BC Managed Care – PPO | Admitting: Physical Therapy

## 2020-08-30 DIAGNOSIS — M542 Cervicalgia: Secondary | ICD-10-CM

## 2020-08-30 DIAGNOSIS — R252 Cramp and spasm: Secondary | ICD-10-CM | POA: Diagnosis not present

## 2020-08-30 DIAGNOSIS — M436 Torticollis: Secondary | ICD-10-CM

## 2020-08-30 DIAGNOSIS — M25511 Pain in right shoulder: Secondary | ICD-10-CM

## 2020-08-30 DIAGNOSIS — R293 Abnormal posture: Secondary | ICD-10-CM | POA: Diagnosis not present

## 2020-08-30 NOTE — Therapy (Signed)
[facility] [place] [address] [place], [place], [address] Phone: [phone]   Fax:  [phone]  Physical Therapy Treatment  Patient Details  Name: Cynthia Lawrence MRN: [id number] Date of Birth: 1971/03/24 Referring Provider (PT): Cheron Every Date: 08/30/2020   PT End of Session - 08/30/20 0815    Visit Number 3    Date for PT Re-Evaluation 10/23/20    PT Start Time 0800    PT Stop Time 0846    PT Time Calculation (min) 46 min    Activity Tolerance Patient tolerated treatment well    Behavior During Therapy [facility] for tasks assessed/performed           Past Medical History:  Diagnosis Date  . Allergy   . Anxiety   . Asthma   . Chronic tonsillitis   . Depression   . Seasonal allergies     Past Surgical History:  Procedure Laterality Date  . ENDOMETRIAL ABLATION    . EYE SURGERY     lasik  . TONSILLECTOMY Bilateral 08/15/2016   Procedure: TONSILLECTOMY;  Surgeon: Drema Halon, MD;  Location: [facility];  Service: ENT;  Laterality: Bilateral;  . WISDOM TOOTH EXTRACTION      There were no vitals filed for this visit.   Subjective Assessment - 08/30/20 0759    Subjective Feeling stiff in her neck this morning.    Currently in Pain? No/denies                             [facility] Adult PT Treatment/Exercise - 08/30/20 0001      Neck Exercises: Standing   Other Standing Exercises Bil shoulder flex, chest press, scap, ext, IR, x10 ea      Neck Exercises: Seated   W Back 15 reps   foam roller    Other Seated Exercise Upper trap str (DB's #5) 5x10"       Shoulder Exercises: Standing   Horizontal ABduction Both;20 reps;Theraband    Theraband Level (Shoulder Horizontal ABduction) Level 2 (Red)    Extension Both;20 reps;Theraband    Theraband Level (Shoulder Extension) Level 2 (Red)    Row Both;20 reps;Theraband    Theraband Level (Shoulder Row) Level 2 (Red)    Retraction  Both;20 reps;Theraband    Theraband Level (Shoulder Retraction) Level 2 (Red)      Modalities   Modalities Electrical Stimulation;Moist Heat      Electrical Stimulation   Electrical Stimulation Location Upper R trap     Electrical Stimulation Action IFC    Electrical Stimulation Parameters Prone    Electrical Stimulation Goals Pain      Manual Therapy   Manual Therapy Soft tissue mobilization    Soft tissue mobilization right upper trap and rhomboid area            Trigger Point Dry Needling - 08/30/20 0001    Consent Given? Yes    Muscles Treated Head and Neck Upper trapezius    Upper Trapezius Response Twitch reponse elicited;Palpable increased muscle length                  PT Short Term Goals - 08/23/20 1105      PT SHORT TERM GOAL #1   Title Samamtha will be independent with her starter HEP.    Time 2    Period Weeks    Status New  PT Long Term Goals - 08/23/20 1105      PT LONG TERM GOAL #1   Title Finn will report cervical pain consistently 0-2/10 on the Numeric Pain Rating Scale with no radiculopathy.    Time 8    Period Weeks    Status New      PT LONG TERM GOAL #2   Title Gennavieve will be able to complete a full work day uninterrupted without having to stop or adjust her schedule due to pain.    Time 8    Status New      PT LONG TERM GOAL #3   Title increase right shoulder IR to WNL's    Time 8    Period Weeks    Status New      PT LONG TERM GOAL #4   Title report right shoulder pain decreased 50% overall    Time 8    Period Weeks    Status New      PT LONG TERM GOAL #5   Title Jowanda will be independent with her DC HEP.    Time 8    Period Weeks    Status New                 Plan - 08/30/20 0815    Clinical Impression Statement Patient came into the clinic today feeling very stiff in her neck. Her R UT was slightly brusied from dry needeling last treatment, patient mentioned that she bruises very easily. PTA  checked brusie and it looked good. Patient deffered aerobic warm up in mask. Patient did well with intro to more ther ex to work on proper posturing to prevent forward head and shoulders.    PT Treatment/Interventions ADLs/Self Care Home Management;Cryotherapy;Electrical Stimulation;Iontophoresis 4mg /ml Dexamethasone;Moist Heat;Ultrasound;Traction;Therapeutic exercise;Therapeutic activities;Neuromuscular re-education;Manual techniques;Patient/family education;Dry needling    PT Next Visit Plan Responds well to estim, hot pack, emphasize shoulder retraction exercises and neck/shoulder strengthening.           Patient will benefit from skilled therapeutic intervention in order to improve the following deficits and impairments:  Decreased range of motion, Increased muscle spasms, Pain, Improper body mechanics, Decreased strength, Postural dysfunction  Visit Diagnosis: Cervicalgia  Acute pain of right shoulder  Abnormal posture  Cramp and spasm  Stiffness of cervical spine     Problem List Patient Active Problem List   Diagnosis Date Noted  . COVID-19 virus infection 06/06/2020  . History of vertigo 12/11/2019  . Concussion without loss of consciousness 12/11/2019  . MDD (major depressive disorder) 08/11/2018  . PTSD (post-traumatic stress disorder) 08/11/2018  . Night terrors 08/11/2018  . Bruxism 08/11/2018  . Tick bite 05/02/2018  . Abnormal urinalysis 12/28/2017  . Dysuria 12/28/2017  . Healthcare maintenance 07/04/2017  . Depression, recurrent (HCC) 07/04/2017    09/03/2017, SPTA 08/30/2020, 8:38 AM  [facility] [address] [place], [place], [place] Phone: [phone]   Fax:  [phone]  Name: Cynthia Lawrence MRN: Okey Regal Date of Birth: July 22, 1971

## 2020-09-01 ENCOUNTER — Encounter: Payer: Self-pay | Admitting: Physical Therapy

## 2020-09-01 ENCOUNTER — Ambulatory Visit: Payer: BC Managed Care – PPO | Admitting: Physical Therapy

## 2020-09-01 ENCOUNTER — Other Ambulatory Visit: Payer: Self-pay

## 2020-09-01 DIAGNOSIS — M25511 Pain in right shoulder: Secondary | ICD-10-CM

## 2020-09-01 DIAGNOSIS — M542 Cervicalgia: Secondary | ICD-10-CM | POA: Diagnosis not present

## 2020-09-01 DIAGNOSIS — R252 Cramp and spasm: Secondary | ICD-10-CM | POA: Diagnosis not present

## 2020-09-01 DIAGNOSIS — R293 Abnormal posture: Secondary | ICD-10-CM | POA: Diagnosis not present

## 2020-09-01 DIAGNOSIS — M436 Torticollis: Secondary | ICD-10-CM | POA: Diagnosis not present

## 2020-09-01 NOTE — Therapy (Signed)
[facility] [place] [address] [place], [place], [address] Phone: [phone]   Fax:  [phone]  Physical Therapy Treatment  Patient Details  Name: Cynthia Lawrence MRN: [id number] Date of Birth: 02/24/1971 Referring Provider (PT): Cheron Every Date: 09/01/2020   PT End of Session - 09/01/20 0833    Visit Number 4    Date for PT Re-Evaluation 10/23/20    Authorization Type BCBS    PT Start Time 0758    PT Stop Time 0850    PT Time Calculation (min) 52 min    Activity Tolerance Patient tolerated treatment well    Behavior During Therapy [facility] for tasks assessed/performed           Past Medical History:  Diagnosis Date  . Allergy   . Anxiety   . Asthma   . Chronic tonsillitis   . Depression   . Seasonal allergies     Past Surgical History:  Procedure Laterality Date  . ENDOMETRIAL ABLATION    . EYE SURGERY     lasik  . TONSILLECTOMY Bilateral 08/15/2016   Procedure: TONSILLECTOMY;  Surgeon: Drema Halon, MD;  Location: [facility];  Service: ENT;  Laterality: Bilateral;  . WISDOM TOOTH EXTRACTION      There were no vitals filed for this visit.   Subjective Assessment - 09/01/20 0800    Subjective Doing better, still a little stiff    Currently in Pain? Yes    Pain Score 2     Pain Location Neck    Pain Orientation Right    Pain Descriptors / Indicators Tightness    Pain Relieving Factors I think the dry needling helps                             [facility] Adult PT Treatment/Exercise - 09/01/20 0001      Neck Exercises: Machines for Strengthening   Cybex Row 20# 2x10    Lat Pull 20# 2x10    Other Machines for Strengthening 5# straight arm pulls, cues for posture and core      Neck Exercises: Standing   Other Standing Exercises I's, Y's and T's, W's 2#    Other Standing Exercises 5# shrugs and upper trap and levator stretches, ER with red tband      Electrical  Stimulation   Electrical Stimulation Location Upper R trap     Electrical Stimulation Action IFC    Electrical Stimulation Parameters supine    Electrical Stimulation Goals Pain      Manual Therapy   Manual Therapy Soft tissue mobilization    Soft tissue mobilization right upper trap and rhomboid area            Trigger Point Dry Needling - 09/01/20 0001    Consent Given? Yes    Muscles Treated Head and Neck Upper trapezius    Upper Trapezius Response Twitch reponse elicited;Palpable increased muscle length                  PT Short Term Goals - 09/01/20 0836      PT SHORT TERM GOAL #1   Title Jalyssa will be independent with her starter HEP.    Status Achieved             PT Long Term Goals - 09/01/20 0836      PT LONG TERM GOAL #1   Title Shailyn will report  cervical pain consistently 0-2/10 on the Numeric Pain Rating Scale with no radiculopathy.    Status On-going      PT LONG TERM GOAL #2   Title Dalyah will be able to complete a full work day uninterrupted without having to stop or adjust her schedule due to pain.    Status On-going                 Plan - 09/01/20 0834    Clinical Impression Statement Overall patient is doing well, she has less spasms in the upper trap, still a significant knot and trigger point in the right upper trap and is having some incresaed tightness and pain in the right cervical area laterally.  ROM is good.  Needs some cues for form and to go slow with the exercises    PT Next Visit Plan continue to work on the postureal mms    Consulted and Agree with Plan of Care Patient           Patient will benefit from skilled therapeutic intervention in order to improve the following deficits and impairments:  Decreased range of motion, Increased muscle spasms, Pain, Improper body mechanics, Decreased strength, Postural dysfunction  Visit Diagnosis: Cervicalgia  Acute pain of right shoulder  Abnormal posture  Cramp and  spasm  Stiffness of cervical spine     Problem List Patient Active Problem List   Diagnosis Date Noted  . COVID-19 virus infection 06/06/2020  . History of vertigo 12/11/2019  . Concussion without loss of consciousness 12/11/2019  . MDD (major depressive disorder) 08/11/2018  . PTSD (post-traumatic stress disorder) 08/11/2018  . Night terrors 08/11/2018  . Bruxism 08/11/2018  . Tick bite 05/02/2018  . Abnormal urinalysis 12/28/2017  . Dysuria 12/28/2017  . Healthcare maintenance 07/04/2017  . Depression, recurrent (HCC) 07/04/2017    Jearld Lesch., PT 09/01/2020, 8:37 AM  [facility] [address] [place], [place], [address] Phone: [phone]   Fax:  [phone]  Name: Cynthia Lawrence MRN: [id number] Date of Birth: [birth year]

## 2020-09-02 ENCOUNTER — Other Ambulatory Visit: Payer: Self-pay | Admitting: Physician Assistant

## 2020-09-02 DIAGNOSIS — F331 Major depressive disorder, recurrent, moderate: Secondary | ICD-10-CM | POA: Diagnosis not present

## 2020-09-03 NOTE — Telephone Encounter (Signed)
I do not see this listed on medication list? Next apt 10/06/20

## 2020-09-08 ENCOUNTER — Encounter: Payer: Self-pay | Admitting: Physical Therapy

## 2020-09-08 ENCOUNTER — Ambulatory Visit: Payer: BC Managed Care – PPO | Admitting: Physical Therapy

## 2020-09-08 ENCOUNTER — Other Ambulatory Visit: Payer: Self-pay

## 2020-09-08 DIAGNOSIS — R252 Cramp and spasm: Secondary | ICD-10-CM

## 2020-09-08 DIAGNOSIS — M25511 Pain in right shoulder: Secondary | ICD-10-CM

## 2020-09-08 DIAGNOSIS — M542 Cervicalgia: Secondary | ICD-10-CM

## 2020-09-08 DIAGNOSIS — R293 Abnormal posture: Secondary | ICD-10-CM

## 2020-09-08 DIAGNOSIS — M436 Torticollis: Secondary | ICD-10-CM | POA: Diagnosis not present

## 2020-09-08 NOTE — Therapy (Signed)
[facility] [address] [place], [place], [address] Phone: [phone]   Fax:  [phone]  Physical Therapy Treatment  Patient Details  Name: Cynthia Lawrence MRN: [id number] Date of Birth: [birth year] Referring Provider (PT): Cheron Every Date: 09/08/2020   PT End of Session - 09/08/20 0836    Visit Number 5    Date for PT Re-Evaluation 10/23/20    PT Start Time 0801    PT Stop Time 0844    PT Time Calculation (min) 43 min    Activity Tolerance Patient tolerated treatment well    Behavior During Therapy [facility] for tasks assessed/performed           Past Medical History:  Diagnosis Date  . Allergy   . Anxiety   . Asthma   . Chronic tonsillitis   . Depression   . Seasonal allergies     Past Surgical History:  Procedure Laterality Date  . ENDOMETRIAL ABLATION    . EYE SURGERY     lasik  . TONSILLECTOMY Bilateral 08/15/2016   Procedure: TONSILLECTOMY;  Surgeon: Drema Halon, MD;  Location: [facility];  Service: ENT;  Laterality: Bilateral;  . WISDOM TOOTH EXTRACTION      There were no vitals filed for this visit.   Subjective Assessment - 09/08/20 0804    Subjective A little sore in the shoulder this morning    Currently in Pain? Yes    Pain Score 4     Pain Location Neck   and shoulder   Pain Orientation Right                             [facility] Adult PT Treatment/Exercise - 09/08/20 0001      Neck Exercises: Machines for Strengthening   UBE (Upper Arm Bike) L2 3 fwd/3bkwd    Cybex Row 20# 2x10    Cybex Chest Press 10# 2x10    Lat Pull 20# 2x10    Other Machines for Strengthening 10# straight arm pulls      Neck Exercises: Standing   Other Standing Exercises I's, Y's and T's, W's 2#    Other Standing Exercises 5# shoulder abd/flex 1x10; 5# shrugs and UT/levator stretches      Manual Therapy   Manual Therapy Soft tissue mobilization    Soft tissue  mobilization B UT with focus on R UT                    PT Short Term Goals - 09/01/20 2409      PT SHORT TERM GOAL #1   Title [name] will be independent with her starter HEP.    Status Achieved             PT Long Term Goals - 09/01/20 0836      PT LONG TERM GOAL #1   Title [name] will report cervical pain consistently 0-2/10 on the Numeric Pain Rating Scale with no radiculopathy.    Status On-going      PT LONG TERM GOAL #2   Title [name] will be able to complete a full work day uninterrupted without having to stop or adjust her schedule due to pain.    Status On-going                 Plan - 09/08/20 7353    Clinical Impression Statement Pt required cuing for posture/form with standing  exercises; esp for core activation with straight arm pulls. Cues for slow eccentric control. No reports of increased pain with exercise. Pt demos palpable tightness B UT R>L and trigger point still present in R UT. Reports relief from DN last rx, requesting again this rx.    PT Treatment/Interventions ADLs/Self Care Home Management;Cryotherapy;Electrical Stimulation;Iontophoresis 4mg /ml Dexamethasone;Moist Heat;Ultrasound;Traction;Therapeutic exercise;Therapeutic activities;Neuromuscular re-education;Manual techniques;Patient/family education;Dry needling    PT Next Visit Plan continue to work on the and Agree with Plan of Care Patient           Patient will benefit from skilled therapeutic intervention in order to improve the following deficits and impairments:  Decreased range of motion,Increased muscle spasms,Pain,Improper body mechanics,Decreased strength,Postural dysfunction  Visit Diagnosis: Cervicalgia  Acute pain of right shoulder  Abnormal posture  Cramp and spasm     Problem List Patient Active Problem List   Diagnosis Date Noted  . COVID-19 virus infection 06/06/2020  . History of vertigo 12/11/2019  . Concussion without  loss of consciousness 12/11/2019  . MDD (major depressive disorder) 08/11/2018  . PTSD (post-traumatic stress disorder) 08/11/2018  . Night terrors 08/11/2018  . Bruxism 08/11/2018  . Tick bite 05/02/2018  . Abnormal urinalysis 12/28/2017  . Dysuria 12/28/2017  . Healthcare maintenance 07/04/2017  . Depression, recurrent (HCC) 07/04/2017   09/03/2017, PT, DPT Lysle Rubens Broderic Bara 09/08/2020, 8:38 AM  [facility] [address] [place], [place], [place] Phone: [phone]   Fax:  [phone]  Name: Cynthia Lawrence MRN: Okey Regal Date of Birth: [birth year]

## 2020-09-10 ENCOUNTER — Other Ambulatory Visit: Payer: Self-pay

## 2020-09-10 ENCOUNTER — Ambulatory Visit: Payer: BC Managed Care – PPO | Admitting: Physical Therapy

## 2020-09-10 ENCOUNTER — Encounter: Payer: Self-pay | Admitting: Physical Therapy

## 2020-09-10 ENCOUNTER — Encounter: Payer: BC Managed Care – PPO | Admitting: Physical Therapy

## 2020-09-10 DIAGNOSIS — M542 Cervicalgia: Secondary | ICD-10-CM

## 2020-09-10 DIAGNOSIS — R293 Abnormal posture: Secondary | ICD-10-CM

## 2020-09-10 DIAGNOSIS — M436 Torticollis: Secondary | ICD-10-CM

## 2020-09-10 DIAGNOSIS — M25511 Pain in right shoulder: Secondary | ICD-10-CM | POA: Diagnosis not present

## 2020-09-10 DIAGNOSIS — R252 Cramp and spasm: Secondary | ICD-10-CM | POA: Diagnosis not present

## 2020-09-10 NOTE — Therapy (Signed)
[address]. [place], [place], [address] Phone: [phone]   Fax:  [phone]  Physical Therapy Treatment  Patient Details  Name: Cynthia Lawrence MRN: [id number] Date of Birth: Sep 28, 1970 Referring Provider (PT): Marikay Alar Date: 09/10/2020   PT End of Session - 09/10/20 0831    Visit Number 6    Date for PT Re-Evaluation 10/23/20    Authorization Type BCBS    PT Start Time 0800    PT Stop Time 0848    PT Time Calculation (min) 48 min    Activity Tolerance Patient tolerated treatment well    Behavior During Therapy [facility] for tasks assessed/performed           Past Medical History:  Diagnosis Date   Allergy    Anxiety    Asthma    Chronic tonsillitis    Depression    Seasonal allergies     Past Surgical History:  Procedure Laterality Date   ENDOMETRIAL ABLATION     EYE SURGERY     lasik   TONSILLECTOMY Bilateral 08/15/2016   Procedure: TONSILLECTOMY;  Surgeon: Rozetta Nunnery, MD;  Location: [facility];  Service: ENT;  Laterality: Bilateral;   WISDOM TOOTH EXTRACTION      There were no vitals filed for this visit.   Subjective Assessment - 09/10/20 0802    Subjective Reportsthat she is feeling better, less pain overall    Currently in Pain? Yes    Pain Score 3     Pain Location Neck    Pain Orientation Right    Aggravating Factors  stress                             [facility] Adult PT Treatment/Exercise - 09/10/20 0001      Neck Exercises: Machines for Strengthening   Cybex Row 20# 2x10    Cybex Chest Press 10# 2x10    Lat Pull 20# 2x10      Neck Exercises: Theraband   Shoulder External Rotation 20 reps;Red      Neck Exercises: Standing   Other Standing Exercises I's, Y's and T's, W's 2#    Other Standing Exercises 5# shrugs with upper trap and levatore      Electrical Stimulation   Electrical Stimulation Location bilateral upper  traps    Electrical Stimulation Action IFC    Electrical Stimulation Parameters supine    Electrical Stimulation Goals Pain      Manual Therapy   Manual Therapy Soft tissue mobilization    Manual therapy comments gentle cervical stretches    Soft tissue mobilization B UT with focus on R UT            Trigger Point Dry Needling - 09/10/20 0001    Consent Given? Yes    Muscles Treated Head and Neck Upper trapezius    Upper Trapezius Response Twitch reponse elicited;Palpable increased muscle length                  PT Short Term Goals - 09/01/20 0836      PT SHORT TERM GOAL #1   Title [name] will be independent with her starter HEP.    Status Achieved             PT Long Term Goals - 09/10/20 0833      PT LONG TERM GOAL #1   Title [name] will report  cervical pain consistently 0-2/10 on the Numeric Pain Rating Scale with no radiculopathy.    Status Partially Met                 Plan - 09/10/20 0832    Clinical Impression Statement Patient reports that she is doing well, reports that stress does increase her pain, she does need cues to continually correct her posture as she tends to round.  The upper traps really are tight and has a lot of LTR's with the dry needling    PT Next Visit Plan continue to work on the postural mms    Consulted and Agree with Plan of Care Patient           Patient will benefit from skilled therapeutic intervention in order to improve the following deficits and impairments:  Decreased range of motion,Increased muscle spasms,Pain,Improper body mechanics,Decreased strength,Postural dysfunction  Visit Diagnosis: Cervicalgia  Acute pain of right shoulder  Abnormal posture  Cramp and spasm  Stiffness of cervical spine     Problem List Patient Active Problem List   Diagnosis Date Noted   COVID-19 virus infection 06/06/2020   History of vertigo 12/11/2019   Concussion without loss of consciousness 12/11/2019   MDD  (major depressive disorder) 08/11/2018   PTSD (post-traumatic stress disorder) 08/11/2018   Night terrors 08/11/2018   Bruxism 08/11/2018   Tick bite 05/02/2018   Abnormal urinalysis 12/28/2017   Dysuria 12/28/2017   Healthcare maintenance 07/04/2017   Depression, recurrent ([place]) 07/04/2017    Sumner Boast., PT 09/10/2020, 8:34 AM  [address]. [place], [place], [address] Phone: [phone]   Fax:  [phone]  Name: Cynthia Lawrence MRN: [id number] Date of Birth: 1970-12-26

## 2020-09-13 ENCOUNTER — Encounter: Payer: Self-pay | Admitting: Physical Therapy

## 2020-09-13 ENCOUNTER — Other Ambulatory Visit: Payer: Self-pay

## 2020-09-13 ENCOUNTER — Ambulatory Visit: Payer: BC Managed Care – PPO | Admitting: Physical Therapy

## 2020-09-13 DIAGNOSIS — M436 Torticollis: Secondary | ICD-10-CM | POA: Diagnosis not present

## 2020-09-13 DIAGNOSIS — M25511 Pain in right shoulder: Secondary | ICD-10-CM | POA: Diagnosis not present

## 2020-09-13 DIAGNOSIS — R252 Cramp and spasm: Secondary | ICD-10-CM | POA: Diagnosis not present

## 2020-09-13 DIAGNOSIS — R293 Abnormal posture: Secondary | ICD-10-CM | POA: Diagnosis not present

## 2020-09-13 DIAGNOSIS — M542 Cervicalgia: Secondary | ICD-10-CM

## 2020-09-13 NOTE — Therapy (Signed)
Cynthia Lawrence, [place], [address] Phone: [phone]   Fax:  [phone]  Physical Therapy Treatment  Patient Details  Name: Cynthia Lawrence MRN: [id number] Date of Birth: Jul 12, 1971 Referring Provider (PT): Marikay Alar Date: 09/13/2020   PT End of Session - 09/13/20 0842    Visit Number 7    Date for PT Re-Evaluation 10/23/20    PT Start Time 0759    PT Stop Time 0845    PT Time Calculation (min) 46 min    Activity Tolerance Patient tolerated treatment well    Behavior During Therapy [facility] for tasks assessed/performed           Past Medical History:  Diagnosis Date   Allergy    Anxiety    Asthma    Chronic tonsillitis    Depression    Seasonal allergies     Past Surgical History:  Procedure Laterality Date   ENDOMETRIAL ABLATION     EYE SURGERY     lasik   TONSILLECTOMY Bilateral 08/15/2016   Procedure: TONSILLECTOMY;  Surgeon: Rozetta Nunnery, MD;  Location: [place];  Service: ENT;  Laterality: Bilateral;   WISDOM TOOTH EXTRACTION      There were no vitals filed for this visit.   Subjective Assessment - 09/13/20 0803    Subjective Pt states R shoulder is really bothering her today    Currently in Pain? Yes    Pain Score 7     Pain Location Shoulder   and neck   Pain Orientation Right                             [facility] Adult PT Treatment/Exercise - 09/13/20 0001      Neck Exercises: Machines for Strengthening   UBE (Upper Arm Bike) constant work x 7 min 30 watts    Cybex Row 25# 2x10    Cybex Chest Press 15# 2x10    Lat Pull 25# 2x10    Other Machines for Strengthening 5# IR/ER 2x10 RUE    Other Machines for Strengthening 10# straight arm pulls      Neck Exercises: Standing   Other Standing Exercises I's, Y's and T's, W's 2#    Other Standing Exercises 5# shrugs with upper trap and levatore      Shoulder Exercises:  ROM/Strengthening   Ball on Wall x10 5 sec hold      Manual Therapy   Manual Therapy Soft tissue mobilization    Manual therapy comments gentle cervical stretches    Soft tissue mobilization B UT with focus on R UT      Neck Exercises: Stretches   Other Neck Stretches corner stretch, ER stretch, bicep stretch            Trigger Point Dry Needling - 09/13/20 0001    Consent Given? Yes    Muscles Treated Head and Neck Upper trapezius    Upper Trapezius Response Twitch reponse elicited;Palpable increased muscle length                  PT Short Term Goals - 09/01/20 0836      PT SHORT TERM GOAL #1   Title Marieta will be independent with her starter HEP.    Status Achieved             PT Long Term Goals - 09/10/20 4854  PT LONG TERM GOAL #1   Title Chelsea will report cervical pain consistently 0-2/10 on the Numeric Pain Rating Scale with no radiculopathy.    Status Partially Met                 Plan - 09/13/20 0842    Clinical Impression Statement Pt reports mild increased pain and tightness with exercise this rx, esp with shoulder IR/ER ex's. Pt required cuing for proper form/posture with stretches. Demos increased tightness in B cervical musculature R>L and trigger points in R UT. Pt reports relief with DN.    PT Treatment/Interventions ADLs/Self Care Home Management;Cryotherapy;Electrical Stimulation;Iontophoresis 19m/ml Dexamethasone;Moist Heat;Ultrasound;Traction;Therapeutic exercise;Therapeutic activities;Neuromuscular re-education;Manual techniques;Patient/family education;Dry needling    PT Next Visit Plan continue to work on the postural mms    Consulted and Agree with Plan of Care Patient           Patient will benefit from skilled therapeutic intervention in order to improve the following deficits and impairments:  Decreased range of motion,Increased muscle spasms,Pain,Improper body mechanics,Decreased strength,Postural dysfunction  Visit  Diagnosis: Cervicalgia  Acute pain of right shoulder  Abnormal posture  Cramp and spasm     Problem List Patient Active Problem List   Diagnosis Date Noted   COVID-19 virus infection 06/06/2020   History of vertigo 12/11/2019   Concussion without loss of consciousness 12/11/2019   MDD (major depressive disorder) 08/11/2018   PTSD (post-traumatic stress disorder) 08/11/2018   Night terrors 08/11/2018   Bruxism 08/11/2018   Tick bite 05/02/2018   Abnormal urinalysis 12/28/2017   Dysuria 12/28/2017   Healthcare maintenance 07/04/2017   [place]ression, recurrent ([place] 07/04/2017   AAmador Cunas PT, DPT ASumner Boast12/20/2021, 9:16 AM  [place] [address]: [phone]  Fax:  [phone] Name: ATiffany CalmesMRN: [id number] of Birth: 7Apr 23, 1972

## 2020-09-15 ENCOUNTER — Encounter: Payer: Self-pay | Admitting: Physical Therapy

## 2020-09-15 ENCOUNTER — Other Ambulatory Visit: Payer: Self-pay

## 2020-09-15 ENCOUNTER — Ambulatory Visit: Payer: BC Managed Care – PPO | Admitting: Physical Therapy

## 2020-09-15 DIAGNOSIS — R293 Abnormal posture: Secondary | ICD-10-CM

## 2020-09-15 DIAGNOSIS — M436 Torticollis: Secondary | ICD-10-CM

## 2020-09-15 DIAGNOSIS — M25511 Pain in right shoulder: Secondary | ICD-10-CM | POA: Diagnosis not present

## 2020-09-15 DIAGNOSIS — R252 Cramp and spasm: Secondary | ICD-10-CM | POA: Diagnosis not present

## 2020-09-15 DIAGNOSIS — M542 Cervicalgia: Secondary | ICD-10-CM

## 2020-09-15 NOTE — Therapy (Signed)
[place]. [place], [place], [address] Phone: [phone]   Fax:  [phone]  Physical Therapy Treatment  Patient Details  Name: Cynthia Lawrence MRN: [id number] Date of Birth: 1971-09-24 Referring Provider (PT): Marikay Alar Date: 09/15/2020   PT End of Session - 09/15/20 0839    Visit Number 8    Date for PT Re-Evaluation 10/23/20    PT Start Time 0800    PT Stop Time 0846    PT Time Calculation (min) 46 min    Activity Tolerance Patient tolerated treatment well    Behavior During Therapy [facility] for tasks assessed/performed           Past Medical History:  Diagnosis Date   Allergy    Anxiety    Asthma    Chronic tonsillitis    Depression    Seasonal allergies     Past Surgical History:  Procedure Laterality Date   ENDOMETRIAL ABLATION     EYE SURGERY     lasik   TONSILLECTOMY Bilateral 08/15/2016   Procedure: TONSILLECTOMY;  Surgeon: Rozetta Nunnery, MD;  Location: [place];  Service: ENT;  Laterality: Bilateral;   WISDOM TOOTH EXTRACTION      There were no vitals filed for this visit.   Subjective Assessment - 09/15/20 0802    Subjective Pt states that she was in a lot of pain last time she left; needs the heat and estim at end of session.    Currently in Pain? Yes    Pain Score 5     Pain Location Shoulder    Pain Orientation Right                             [facility] Adult PT Treatment/Exercise - 09/15/20 0001      Neck Exercises: Machines for Strengthening   UBE (Upper Arm Bike) L2 3 fwd/3 bkwd    Cybex Row 20# 2x10    Lat Pull 20# 2x10      Neck Exercises: Theraband   Shoulder External Rotation 20 reps;Red    Shoulder Internal Rotation 20 reps;Red      Neck Exercises: Standing   Other Standing Exercises I's, Y's and T's, W's 2#    Other Standing Exercises 5# shrugs with upper trap and levator; red scap stab 3 ways x5 B       Electrical Stimulation   Electrical Stimulation Location bilateral upper traps    Electrical Stimulation Action IFC    Electrical Stimulation Parameters supine    Electrical Stimulation Goals Pain      Manual Therapy   Manual Therapy Soft tissue mobilization    Manual therapy comments gentle cervical stretches    Soft tissue mobilization B UT with focus on R UT      Neck Exercises: Stretches   Other Neck Stretches corner stretch, ER stretch, bicep stretch; ball on wall stretch                    PT Short Term Goals - 09/01/20 0836      PT SHORT TERM GOAL #1   Title Tytiana will be independent with her starter HEP.    Status Achieved             PT Long Term Goals - 09/10/20 0833      PT LONG TERM GOAL #1   Title Hailei will report cervical  pain consistently 0-2/10 on the Numeric Pain Rating Scale with no radiculopathy.    Status Partially Met                 Plan - 09/15/20 0839    Clinical Impression Statement Pt reporting increased pain from increase in exercises/intensity last rx. Regressed weights for machine interventions and focused more on stretching and manual tx this rx. Trigger point release to R UT along with gentle cervical stretching and soft tissue to B UT. No c/o of increased pain with ex's this rx. Requested estim and heat for pain relief at end of session.    PT Treatment/Interventions ADLs/Self Care Home Management;Cryotherapy;Electrical Stimulation;Iontophoresis 25m/ml Dexamethasone;Moist Heat;Ultrasound;Traction;Therapeutic exercise;Therapeutic activities;Neuromuscular re-education;Manual techniques;Patient/family education;Dry needling    PT Next Visit Plan continue to work on the postural mms    Consulted and Agree with Plan of Care Patient           Patient will benefit from skilled therapeutic intervention in order to improve the following deficits and impairments:  Decreased range of motion,Increased muscle spasms,Pain,Improper  body mechanics,Decreased strength,Postural dysfunction  Visit Diagnosis: Cervicalgia  Acute pain of right shoulder  Abnormal posture  Cramp and spasm  Stiffness of cervical spine     Problem List Patient Active Problem List   Diagnosis Date Noted   COVID-19 virus infection 06/06/2020   History of vertigo 12/11/2019   Concussion without loss of consciousness 12/11/2019   MDD (major depressive disorder) 08/11/2018   PTSD (post-traumatic stress disorder) 08/11/2018   Night terrors 08/11/2018   Bruxism 08/11/2018   Tick bite 05/02/2018   Abnormal urinalysis 12/28/2017   Dysuria 12/28/2017   Healthcare maintenance 07/04/2017   Depression, recurrent ([place] 07/04/2017   AAmador Cunas PT, DPT ADonald ProseSugg 09/15/2020, 8:42 AM  [place] [address]Phone: [phone]  Fax:  [phone] Name: ADorsie SethiMRN: [id number] of Birth: [birth year]
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
## 2020-09-21 ENCOUNTER — Encounter: Payer: Self-pay | Admitting: Physical Therapy

## 2020-09-21 ENCOUNTER — Other Ambulatory Visit: Payer: Self-pay

## 2020-09-21 ENCOUNTER — Ambulatory Visit: Payer: BC Managed Care – PPO | Admitting: Physical Therapy

## 2020-09-21 ENCOUNTER — Ambulatory Visit (INDEPENDENT_AMBULATORY_CARE_PROVIDER_SITE_OTHER): Payer: BC Managed Care – PPO | Admitting: Family Medicine

## 2020-09-21 VITALS — BP 122/86 | HR 81 | Ht 63.0 in | Wt 186.0 lb

## 2020-09-21 DIAGNOSIS — M542 Cervicalgia: Secondary | ICD-10-CM | POA: Diagnosis not present

## 2020-09-21 DIAGNOSIS — M6740 Ganglion, unspecified site: Secondary | ICD-10-CM | POA: Diagnosis not present

## 2020-09-21 DIAGNOSIS — R252 Cramp and spasm: Secondary | ICD-10-CM

## 2020-09-21 DIAGNOSIS — M436 Torticollis: Secondary | ICD-10-CM

## 2020-09-21 DIAGNOSIS — M25511 Pain in right shoulder: Secondary | ICD-10-CM | POA: Diagnosis not present

## 2020-09-21 DIAGNOSIS — R293 Abnormal posture: Secondary | ICD-10-CM | POA: Diagnosis not present

## 2020-09-21 NOTE — Patient Instructions (Signed)
Thank you for coming in today.  Continue the PT and home exercises.   Recheck with me as needed.   Consider gentle leader for [place].

## 2020-09-21 NOTE — Therapy (Signed)
[place]. [place], [place], [address] Phone: [phone]   Fax:  [phone]  Physical Therapy Treatment  Patient Details  Name: Cynthia Lawrence MRN: [id number] Date of Birth: [birth year] Referring Provider (PT): Marikay Alar Date: 09/21/2020   PT End of Session - 09/21/20 0841    Visit Number 9    Date for PT Re-Evaluation 10/23/20    Authorization Type BCBS    PT Start Time 0758    PT Stop Time 0851    PT Time Calculation (min) 53 min    Activity Tolerance Patient tolerated treatment well    Behavior During Therapy [facility] for tasks assessed/performed           Past Medical History:  Diagnosis Date  . Allergy   . Anxiety   . Asthma   . Chronic tonsillitis   . Depression   . Seasonal allergies     Past Surgical History:  Procedure Laterality Date  . ENDOMETRIAL ABLATION    . EYE SURGERY     lasik  . TONSILLECTOMY Bilateral 08/15/2016   Procedure: TONSILLECTOMY;  Surgeon: Rozetta Nunnery, MD;  Location: [place];  Service: ENT;  Laterality: Bilateral;  . WISDOM TOOTH EXTRACTION      There were no vitals filed for this visit.   Subjective Assessment - 09/21/20 0801    Subjective Doing, neck is hurting a little bit, shoulder is a lot better. No limitations at home.    Currently in Pain? Yes    Pain Score 5     Pain Location Neck                             [facility] Adult PT Treatment/Exercise - 09/21/20 0001      Neck Exercises: Machines for Strengthening   UBE (Upper Arm Bike) L2 3 fwd/3 bkwd    Cybex Row 25# 2x10    Lat Pull 25# 2x10      Neck Exercises: Theraband   Shoulder External Rotation 20 reps;Red      Neck Exercises: Standing   Other Standing Exercises Standing Abd Flex combo 2x10    Other Standing Exercises 7# shrugs and upper trap and levator stretches, Hori abd  with red tband      Neck Exercises: Seated   Neck Retraction 20 reps;3  secs      Electrical Stimulation   Electrical Stimulation Location bilateral upper traps    Electrical Stimulation Action IFC    Electrical Stimulation Parameters supine    Electrical Stimulation Goals Pain      Manual Therapy   Manual Therapy Soft tissue mobilization    Manual therapy comments gentle cervical stretches    Soft tissue mobilization B UT with focus on R UT            Trigger Point Dry Needling - 09/21/20 0001    Consent Given? Yes    Upper Trapezius Response Twitch reponse elicited;Palpable increased muscle length                  PT Short Term Goals - 09/01/20 0836      PT SHORT TERM GOAL #1   Title Cing will be independent with her starter HEP.    Status Achieved             PT Long Term Goals - 09/10/20 6962      PT LONG  TERM GOAL #1   Title Coren will report cervical pain consistently 0-2/10 on the Numeric Pain Rating Scale with no radiculopathy.    Status Partially Met                 Plan - 09/21/20 0841    Clinical Impression Statement Pt reports and overall improvement with R shoulder pain, but continues to have some neck pain. Reports a good stretch with standing levator stretch cervical side bending to L side. Tactile cues for posture needed with seated rows, verbal cues needed to squeeze shoulder blades together with horizontal abduction. Increase tissue elasticity noted after PT performed DN.    Stability/Clinical Decision Making Stable/Uncomplicated    Rehab Potential Good    PT Frequency 2x / week    PT Duration 8 weeks    PT Treatment/Interventions ADLs/Self Care Home Management;Cryotherapy;Electrical Stimulation;Iontophoresis 57m/ml Dexamethasone;Moist Heat;Ultrasound;Traction;Therapeutic exercise;Therapeutic activities;Neuromuscular re-education;Manual techniques;Patient/family education;Dry needling    PT Next Visit Plan continue to work on the postural mms           Patient will benefit from skilled therapeutic  intervention in order to improve the following deficits and impairments:  Decreased range of motion,Increased muscle spasms,Pain,Improper body mechanics,Decreased strength,Postural dysfunction  Visit Diagnosis: Acute pain of right shoulder  Cervicalgia  Abnormal posture  Cramp and spasm  Stiffness of cervical spine     Problem List Patient Active Problem List   Diagnosis Date Noted  . COVID-19 virus infection 06/06/2020  . History of vertigo 12/11/2019  . Concussion without loss of consciousness 12/11/2019  . MDD (major depressive disorder) 08/11/2018  . PTSD (post-traumatic stress disorder) 08/11/2018  . Night terrors 08/11/2018  . Bruxism 08/11/2018  . Tick bite 05/02/2018  . Abnormal urinalysis 12/28/2017  . Dysuria 12/28/2017  . Healthcare maintenance 07/04/2017  . Depression, recurrent ([place] 07/04/2017    RScot Jun PTA 09/21/2020, 8:45 AM  [place] [address]Phone: [phone]  Fax:  [phone] Name: Cynthia ChrismanMRN: [id number] of Birth: 710-17-1972

## 2020-09-22 DIAGNOSIS — Z20828 Contact with and (suspected) exposure to other viral communicable diseases: Secondary | ICD-10-CM | POA: Diagnosis not present

## 2020-09-22 DIAGNOSIS — J4521 Mild intermittent asthma with (acute) exacerbation: Secondary | ICD-10-CM | POA: Diagnosis not present

## 2020-09-23 ENCOUNTER — Ambulatory Visit: Payer: BC Managed Care – PPO | Admitting: Physical Therapy

## 2020-09-23 ENCOUNTER — Other Ambulatory Visit: Payer: Self-pay

## 2020-09-23 ENCOUNTER — Encounter: Payer: Self-pay | Admitting: Physical Therapy

## 2020-09-23 DIAGNOSIS — R293 Abnormal posture: Secondary | ICD-10-CM

## 2020-09-23 DIAGNOSIS — M436 Torticollis: Secondary | ICD-10-CM

## 2020-09-23 DIAGNOSIS — R252 Cramp and spasm: Secondary | ICD-10-CM | POA: Diagnosis not present

## 2020-09-23 DIAGNOSIS — M25511 Pain in right shoulder: Secondary | ICD-10-CM

## 2020-09-23 DIAGNOSIS — M542 Cervicalgia: Secondary | ICD-10-CM | POA: Diagnosis not present

## 2020-09-23 NOTE — Therapy (Signed)
[place]. [place], [place], [address] Phone: [phone]   Fax:  [phone]  Physical Therapy Treatment  Patient Details  Name: Cynthia Lawrence MRN: [id number] Date of Birth: 1970-10-19 Referring Provider (PT): Marikay Alar Date: 09/23/2020   PT End of Session - 09/23/20 0834    Visit Number 10    Date for PT Re-Evaluation 10/23/20    Authorization Type BCBS    PT Start Time 0758    PT Stop Time 0850    PT Time Calculation (min) 52 min    Activity Tolerance Patient tolerated treatment well    Behavior During Therapy [facility] for tasks assessed/performed           Past Medical History:  Diagnosis Date  . Allergy   . Anxiety   . Asthma   . Chronic tonsillitis   . Depression   . Seasonal allergies     Past Surgical History:  Procedure Laterality Date  . ENDOMETRIAL ABLATION    . EYE SURGERY     lasik  . TONSILLECTOMY Bilateral 08/15/2016   Procedure: TONSILLECTOMY;  Surgeon: Rozetta Nunnery, MD;  Location: [place];  Service: ENT;  Laterality: Bilateral;  . WISDOM TOOTH EXTRACTION      There were no vitals filed for this visit.   Subjective Assessment - 09/23/20 0758    Subjective "Not bad today, pretty good actually"    Currently in Pain? Yes    Pain Score 1     Pain Location Neck                             [facility] Adult PT Treatment/Exercise - 09/23/20 0001      Neck Exercises: Machines for Strengthening   UBE (Upper Arm Bike) L2 3 fwd/3 bkwd    Cybex Row 35# 2x10    Cybex Chest Press 20# 2x10    Lat Pull 35# 2x10      Neck Exercises: Standing   Other Standing Exercises Standing Abd Flex combo 2x10    Other Standing Exercises 7# shrugs and upper trap and levator stretches, Hori abd  with red tband; 4lb OHP 2x10      Neck Exercises: Seated   Neck Retraction 20 reps;3 secs      Electrical Stimulation   Electrical Stimulation Location bilateral  upper traps    Electrical Stimulation Action IFC    Electrical Stimulation Parameters supine    Electrical Stimulation Goals Pain            Trigger Point Dry Needling - 09/23/20 0001    Consent Given? Yes    Upper Trapezius Response Twitch reponse elicited;Palpable increased muscle length                  PT Short Term Goals - 09/01/20 0836      PT SHORT TERM GOAL #1   Title [place] will be independent with her starter HEP.    Status Achieved             PT Long Term Goals - 09/23/20 0834      PT LONG TERM GOAL #1   Title Samhita will report cervical pain consistently 0-2/10 on the Numeric Pain Rating Scale with no radiculopathy.    Status Partially Met      PT LONG TERM GOAL #2   Title Emaan will be able to complete a full work  day uninterrupted without having to stop or adjust her schedule due to pain.      PT LONG TERM GOAL #3   Title increase right shoulder IR to WNL's    Status On-going      PT LONG TERM GOAL #4   Title report right shoulder pain decreased 50% overall    Status Partially Met      PT LONG TERM GOAL #5   Title Michalina will be independent with her DC HEP.    Status Partially Met                 Plan - 09/23/20 0835    Clinical Impression Statement Pt is doing well and is progressing towards goals. She rep rots decrease pain overall with ADLs. Increase resistance tolerated with seated rows and lats. Cues needed to engage core with standing OHP. Again reports a stretching sensation with levator stretches.    Stability/Clinical Decision Making Stable/Uncomplicated    Rehab Potential Good    PT Frequency 2x / week    PT Duration 8 weeks    PT Treatment/Interventions ADLs/Self Care Home Management;Cryotherapy;Electrical Stimulation;Iontophoresis 46m/ml Dexamethasone;Moist Heat;Ultrasound;Traction;Therapeutic exercise;Therapeutic activities;Neuromuscular re-education;Manual techniques;Patient/family education;Dry needling    PT Next  Visit Plan continue to work on the postural mms           Patient will benefit from skilled therapeutic intervention in order to improve the following deficits and impairments:  Decreased range of motion,Increased muscle spasms,Pain,Improper body mechanics,Decreased strength,Postural dysfunction  Visit Diagnosis: Abnormal posture  Cramp and spasm  Stiffness of cervical spine  Cervicalgia  Acute pain of right shoulder     Problem List Patient Active Problem List   Diagnosis Date Noted  . COVID-19 virus infection 06/06/2020  . History of vertigo 12/11/2019  . Concussion without loss of consciousness 12/11/2019  . MDD (major depressive disorder) 08/11/2018  . PTSD (post-traumatic stress disorder) 08/11/2018  . Night terrors 08/11/2018  . Bruxism 08/11/2018  . Tick bite 05/02/2018  . Dysuria 12/28/2017  . Healthcare maintenance 07/04/2017  . Depression, recurrent (HRafael Hernandez 07/04/2017    ASumner Boast PTA 09/23/2020, 9:32 AM  [place] [address]Phone: [phone]  Fax:  [phone] Name: Cynthia HillmanMRN: [id number] of Birth: 704-04-1971

## 2020-09-30 ENCOUNTER — Other Ambulatory Visit: Payer: Self-pay

## 2020-09-30 ENCOUNTER — Ambulatory Visit: Payer: BC Managed Care – PPO | Attending: Family Medicine | Admitting: Physical Therapy

## 2020-09-30 ENCOUNTER — Encounter: Payer: Self-pay | Admitting: Physical Therapy

## 2020-09-30 DIAGNOSIS — R252 Cramp and spasm: Secondary | ICD-10-CM | POA: Insufficient documentation

## 2020-09-30 DIAGNOSIS — F331 Major depressive disorder, recurrent, moderate: Secondary | ICD-10-CM | POA: Diagnosis not present

## 2020-09-30 DIAGNOSIS — M436 Torticollis: Secondary | ICD-10-CM | POA: Insufficient documentation

## 2020-09-30 DIAGNOSIS — M542 Cervicalgia: Secondary | ICD-10-CM | POA: Insufficient documentation

## 2020-09-30 DIAGNOSIS — R293 Abnormal posture: Secondary | ICD-10-CM | POA: Diagnosis not present

## 2020-09-30 NOTE — Therapy (Signed)
[place]. [place], [place], [address] Phone: [phone]   Fax:  [phone]  Physical Therapy Treatment  Patient Details  Name: Cynthia Lawrence MRN: [id number] Date of Birth: 1970/12/02 Referring Provider (PT): Marikay Alar Date: 09/30/2020   PT End of Session - 09/30/20 0843    Visit Number 11    Date for PT Re-Evaluation 10/23/20    Authorization Type BCBS    PT Start Time 0800    PT Stop Time 0851    PT Time Calculation (min) 51 min    Activity Tolerance Patient tolerated treatment well    Behavior During Therapy [name] for tasks assessed/performed           Past Medical History:  Diagnosis Date  . Allergy   . Anxiety   . Asthma   . Chronic tonsillitis   . Depression   . Seasonal allergies     Past Surgical History:  Procedure Laterality Date  . ENDOMETRIAL ABLATION    . EYE SURGERY     lasik  . TONSILLECTOMY Bilateral 08/15/2016   Procedure: TONSILLECTOMY;  Surgeon: Rozetta Nunnery, MD;  Location: [place];  Service: ENT;  Laterality: Bilateral;  . WISDOM TOOTH EXTRACTION      There were no vitals filed for this visit.   Subjective Assessment - 09/30/20 0804    Subjective "Ok not great, my neck kinda stiff today"    Currently in Pain? No/denies                             [facility] Adult PT Treatment/Exercise - 09/30/20 0001      Neck Exercises: Machines for Strengthening   UBE (Upper Arm Bike) L2 3 fwd/3 bkwd    Cybex Row 35# 2x10    Lat Pull 35# 2x10      Neck Exercises: Standing   Other Standing Exercises 7# shrugs and upper trap and levator stretches, ER with red tband; 4lb OHP 2x10      Neck Exercises: Seated   Neck Retraction 20 reps;3 secs      Modalities   Modalities Ultrasound      Moist Heat Therapy   Number Minutes Moist Heat 11 Minutes    Moist Heat Location Cervical      Ultrasound   Ultrasound Location Upper R trap     Ultrasound Parameters 1Mhz 1w/cm2    Ultrasound Goals Pain                    PT Short Term Goals - 09/01/20 0836      PT SHORT TERM GOAL #1   Title Cynthia Lawrence will be independent with her starter HEP.    Status Achieved             PT Long Term Goals - 09/30/20 0844      PT LONG TERM GOAL #2   Title Cynthia Lawrence will be able to complete a full work day uninterrupted without having to stop or adjust her schedule due to pain.    Status Partially Met                 Plan - 09/30/20 0846    Clinical Impression Statement Pt did well with today's treatment session. Initial neck tightness improved after [place]. Good strength and ROM with all exercises interventions. Cues to engage core with standing overhead press. Reinforced the importance of  at home stretching.    Stability/Clinical Decision Making Stable/Uncomplicated    Rehab Potential Good    PT Frequency 2x / week    PT Duration 8 weeks    PT Treatment/Interventions ADLs/Self Care Home Management;Cryotherapy;Electrical Stimulation;Iontophoresis 74m/ml Dexamethasone;Moist Heat;Ultrasound;Traction;Therapeutic exercise;Therapeutic activities;Neuromuscular re-education;Manual techniques;Patient/family education;Dry needling    PT Next Visit Plan continue to work on the postural mms           Patient will benefit from skilled therapeutic intervention in order to improve the following deficits and impairments:  Decreased range of motion,Increased muscle spasms,Pain,Improper body mechanics,Decreased strength,Postural dysfunction  Visit Diagnosis: Cramp and spasm  Stiffness of cervical spine  Cervicalgia  Abnormal posture     Problem List Patient Active Problem List   Diagnosis Date Noted  . COVID-19 virus infection 06/06/2020  . History of vertigo 12/11/2019  . Concussion without loss of consciousness 12/11/2019  . MDD (major depressive disorder) 08/11/2018  . PTSD (post-traumatic stress disorder) 08/11/2018  .  Night terrors 08/11/2018  . Bruxism 08/11/2018  . Tick bite 05/02/2018  . Dysuria 12/28/2017  . Healthcare maintenance 07/04/2017  . Depression, recurrent ([place] 07/04/2017    RScot Jun PTA 09/30/2020, 8:49 AM  [place] [address]Phone: [phone]  Fax:  [phone] Name: ABaneza BartoszekMRN: [id number] of Birth: [birth year]

## 2020-10-06 ENCOUNTER — Other Ambulatory Visit: Payer: Self-pay

## 2020-10-06 ENCOUNTER — Ambulatory Visit (INDEPENDENT_AMBULATORY_CARE_PROVIDER_SITE_OTHER): Payer: BC Managed Care – PPO | Admitting: Physician Assistant

## 2020-10-06 ENCOUNTER — Encounter: Payer: Self-pay | Admitting: Physician Assistant

## 2020-10-06 VITALS — BP 159/104 | HR 95

## 2020-10-06 DIAGNOSIS — F900 Attention-deficit hyperactivity disorder, predominantly inattentive type: Secondary | ICD-10-CM

## 2020-10-06 DIAGNOSIS — F5102 Adjustment insomnia: Secondary | ICD-10-CM

## 2020-10-06 DIAGNOSIS — F4321 Adjustment disorder with depressed mood: Secondary | ICD-10-CM

## 2020-10-06 DIAGNOSIS — F331 Major depressive disorder, recurrent, moderate: Secondary | ICD-10-CM | POA: Diagnosis not present

## 2020-10-06 DIAGNOSIS — F4310 Post-traumatic stress disorder, unspecified: Secondary | ICD-10-CM | POA: Diagnosis not present

## 2020-10-06 DIAGNOSIS — Z630 Problems in relationship with spouse or partner: Secondary | ICD-10-CM

## 2020-10-06 MED ORDER — CITALOPRAM HYDROBROMIDE 40 MG PO TABS: 40.0000 mg | ORAL_TABLET | Freq: Every day | ORAL | 1 refills | Status: DC

## 2020-10-06 MED ORDER — AMPHETAMINE-DEXTROAMPHETAMINE 5 MG PO TABS: ORAL_TABLET | ORAL | 0 refills | Status: DC

## 2020-10-06 MED ORDER — TRAZODONE HCL 100 MG PO TABS
100.0000 mg | ORAL_TABLET | Freq: Every evening | ORAL | 2 refills | Status: DC | PRN
Start: 2020-10-06 — End: 2020-11-25
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
## 2020-10-23 DIAGNOSIS — F331 Major depressive disorder, recurrent, moderate: Secondary | ICD-10-CM | POA: Diagnosis not present

## 2020-10-27 DIAGNOSIS — F331 Major depressive disorder, recurrent, moderate: Secondary | ICD-10-CM | POA: Diagnosis not present

## 2020-11-03 DIAGNOSIS — F331 Major depressive disorder, recurrent, moderate: Secondary | ICD-10-CM | POA: Diagnosis not present

## 2020-11-11 DIAGNOSIS — F331 Major depressive disorder, recurrent, moderate: Secondary | ICD-10-CM | POA: Diagnosis not present

## 2020-11-17 DIAGNOSIS — F331 Major depressive disorder, recurrent, moderate: Secondary | ICD-10-CM | POA: Diagnosis not present

## 2020-11-25 ENCOUNTER — Other Ambulatory Visit: Payer: Self-pay

## 2020-11-25 ENCOUNTER — Ambulatory Visit (INDEPENDENT_AMBULATORY_CARE_PROVIDER_SITE_OTHER): Payer: BC Managed Care – PPO | Admitting: Physician Assistant

## 2020-11-25 ENCOUNTER — Encounter: Payer: Self-pay | Admitting: Physician Assistant

## 2020-11-25 DIAGNOSIS — F4321 Adjustment disorder with depressed mood: Secondary | ICD-10-CM

## 2020-11-25 DIAGNOSIS — F900 Attention-deficit hyperactivity disorder, predominantly inattentive type: Secondary | ICD-10-CM | POA: Diagnosis not present

## 2020-11-25 DIAGNOSIS — Z630 Problems in relationship with spouse or partner: Secondary | ICD-10-CM

## 2020-11-25 DIAGNOSIS — F4310 Post-traumatic stress disorder, unspecified: Secondary | ICD-10-CM

## 2020-11-25 DIAGNOSIS — F331 Major depressive disorder, recurrent, moderate: Secondary | ICD-10-CM | POA: Diagnosis not present

## 2020-11-25 DIAGNOSIS — F5102 Adjustment insomnia: Secondary | ICD-10-CM

## 2020-11-25 MED ORDER — AMPHETAMINE-DEXTROAMPHETAMINE 5 MG PO TABS: ORAL_TABLET | ORAL | 0 refills | Status: DC

## 2020-11-25 MED ORDER — TRAZODONE HCL 100 MG PO TABS: 100.0000 mg | ORAL_TABLET | Freq: Every evening | ORAL | 1 refills | Status: DC | PRN

## 2020-11-25 MED ORDER — AMPHETAMINE-DEXTROAMPHETAMINE 5 MG PO TABS: 5.0000 mg | ORAL_TABLET | Freq: Two times a day (BID) | ORAL | 0 refills | Status: DC
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
## 2020-12-06 ENCOUNTER — Ambulatory Visit (INDEPENDENT_AMBULATORY_CARE_PROVIDER_SITE_OTHER): Payer: BC Managed Care – PPO | Admitting: Family Medicine

## 2020-12-06 ENCOUNTER — Other Ambulatory Visit: Payer: Self-pay

## 2020-12-06 ENCOUNTER — Ambulatory Visit (INDEPENDENT_AMBULATORY_CARE_PROVIDER_SITE_OTHER): Payer: BC Managed Care – PPO

## 2020-12-06 ENCOUNTER — Ambulatory Visit: Payer: Self-pay

## 2020-12-06 VITALS — BP 122/86 | HR 82 | Ht 63.0 in | Wt 184.2 lb

## 2020-12-06 DIAGNOSIS — G8929 Other chronic pain: Secondary | ICD-10-CM

## 2020-12-06 DIAGNOSIS — M25562 Pain in left knee: Secondary | ICD-10-CM

## 2020-12-06 NOTE — Progress Notes (Signed)
X-ray left knee looks normal to radiology

## 2020-12-06 NOTE — Patient Instructions (Addendum)
Thank you for coming in today.  Please get an Xray today before you leave today.  I think you have prepatellar bursitis.   Plan for home exercises. View at [email or web address] using code: [email or web address]  Let me know if you do not improve and I can order PT.   Please use voltaren gel up to 4x daily for pain as needed.    I recommend you obtained a compression sleeve to help with your joint problems. There are many options on the market however I recommend obtaining a full knee Body Helix compression sleeve.  You can find information (including how to appropriate measure yourself for sizing) can be found at www.Body [email or web address].ch.  Many of these products are health savings account (HSA) eligible.   You can use the compression sleeve at any time throughout the day but is most important to use while being active as well as for 2 hours post-activity.   It is appropriate to ice following activity with the compression sleeve in place.

## 2020-12-08 DIAGNOSIS — F331 Major depressive disorder, recurrent, moderate: Secondary | ICD-10-CM | POA: Diagnosis not present

## 2020-12-14 DIAGNOSIS — J029 Acute pharyngitis, unspecified: Secondary | ICD-10-CM | POA: Diagnosis not present

## 2020-12-15 DIAGNOSIS — Z202 Contact with and (suspected) exposure to infections with a predominantly sexual mode of transmission: Secondary | ICD-10-CM | POA: Diagnosis not present

## 2020-12-15 DIAGNOSIS — R109 Unspecified abdominal pain: Secondary | ICD-10-CM | POA: Diagnosis not present

## 2020-12-15 DIAGNOSIS — N898 Other specified noninflammatory disorders of vagina: Secondary | ICD-10-CM | POA: Diagnosis not present

## 2020-12-16 DIAGNOSIS — N939 Abnormal uterine and vaginal bleeding, unspecified: Secondary | ICD-10-CM | POA: Diagnosis not present

## 2020-12-16 DIAGNOSIS — R102 Pelvic and perineal pain: Secondary | ICD-10-CM | POA: Diagnosis not present

## 2020-12-20 DIAGNOSIS — A599 Trichomoniasis, unspecified: Secondary | ICD-10-CM | POA: Diagnosis not present

## 2020-12-20 DIAGNOSIS — A7489 Other chlamydial diseases: Secondary | ICD-10-CM | POA: Diagnosis not present

## 2020-12-20 DIAGNOSIS — N760 Acute vaginitis: Secondary | ICD-10-CM | POA: Diagnosis not present

## 2020-12-22 DIAGNOSIS — F331 Major depressive disorder, recurrent, moderate: Secondary | ICD-10-CM | POA: Diagnosis not present

## 2020-12-27 DIAGNOSIS — A5900 Urogenital trichomoniasis, unspecified: Secondary | ICD-10-CM | POA: Diagnosis not present

## 2020-12-27 DIAGNOSIS — N939 Abnormal uterine and vaginal bleeding, unspecified: Secondary | ICD-10-CM | POA: Diagnosis not present

## 2020-12-27 DIAGNOSIS — A749 Chlamydial infection, unspecified: Secondary | ICD-10-CM | POA: Diagnosis not present

## 2020-12-27 DIAGNOSIS — R102 Pelvic and perineal pain: Secondary | ICD-10-CM | POA: Diagnosis not present
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
## 2020-12-28 ENCOUNTER — Encounter: Payer: Self-pay | Admitting: Family Medicine

## 2020-12-28 ENCOUNTER — Other Ambulatory Visit: Payer: Self-pay

## 2020-12-28 ENCOUNTER — Ambulatory Visit (INDEPENDENT_AMBULATORY_CARE_PROVIDER_SITE_OTHER): Payer: BC Managed Care – PPO | Admitting: Family Medicine

## 2020-12-28 DIAGNOSIS — K429 Umbilical hernia without obstruction or gangrene: Secondary | ICD-10-CM | POA: Diagnosis not present

## 2020-12-28 NOTE — Patient Instructions (Addendum)
Thank you for coming in today.  We can watch the hernia. If not getting worse ok to just let it be.   I think you need a  PCP Rinaldo Cloud PA is great.  Nature conservation officer at [facility] [address] [place], [place] [address] [phone]  Luana Shu, DO [facility].  Address: [address] [place], [place], [place] [address] Phone: [phone]    Umbilical Hernia, Adult  A hernia is a bulge of tissue that pushes through an opening between muscles. An umbilical hernia happens in the abdomen, near the belly button (umbilicus). The hernia may contain tissues from the small intestine, large intestine, or fatty tissue covering the intestines (omentum). Umbilical hernias in adults tend to get worse over time, and they require surgical treatment. There are several types of umbilical hernias. You may have:  A hernia located just above or below the umbilicus (indirect hernia). This is the most common type of umbilical hernia in adults.  A hernia that forms through an opening formed by the umbilicus (direct hernia).  A hernia that comes and goes (reducible hernia). A reducible hernia may be visible only when you strain, lift something heavy, or cough. This type of hernia can be pushed back into the abdomen (reduced).  A hernia that traps abdominal tissue inside the hernia (incarcerated hernia). This type of hernia cannot be reduced.  A hernia that cuts off blood flow to the tissues inside the hernia (strangulated hernia). The tissues can start to die if this happens. This type of hernia requires emergency treatment. What are the causes? An umbilical hernia happens when tissue inside the abdomen presses on a weak area of the abdominal muscles. What increases the risk? You may have a greater risk of this condition if you:  Are obese.  Have had several pregnancies.  Have a buildup of fluid inside your abdomen (ascites).  Have had surgery that weakens the abdominal  muscles. What are the signs or symptoms? The main symptom of this condition is a painless bulge at or near the belly button. A reducible hernia may be visible only when you strain, lift something heavy, or cough. Other symptoms may include:  Dull pain.  A feeling of pressure. Symptoms of a strangulated hernia may include:  Pain that gets increasingly worse.  Nausea and vomiting.  Pain when pressing on the hernia.  Skin over the hernia becoming red or purple.  Constipation.  Blood in the stool. How is this diagnosed? This condition may be diagnosed based on:  A physical exam. You may be asked to cough or strain while standing. These actions increase the pressure inside your abdomen and force the hernia through the opening in your muscles. Your health care provider may try to reduce the hernia by pressing on it.  Your symptoms and medical history. How is this treated? Surgery is the only treatment for an umbilical hernia. Surgery for a strangulated hernia is done as soon as possible. If you have a small hernia that is not incarcerated, you may need to lose weight before having surgery. Follow these instructions at home:  Lose weight, if told by your health care provider.  Do not try to push the hernia back in.  Watch your hernia for any changes in color or size. Tell your health care provider if any changes occur.  You may need to avoid activities that increase pressure on your hernia.  Do not lift anything that is heavier than 10 lb (4.5 kg)  until your health care provider says that this is safe.  Take over-the-counter and prescription medicines only as told by your health care provider.  Keep all follow-up visits as told by your health care provider. This is important. Contact a health care provider if:  Your hernia gets larger.  Your hernia becomes painful. Get help right away if:  You develop sudden, severe pain near the area of your hernia.  You have pain as  well as nausea or vomiting.  You have pain and the skin over your hernia changes color.  You develop a fever. This information is not intended to replace advice given to you by your health care provider. Make sure you discuss any questions you have with your health care provider. Document Revised: 10/24/2017 Document Reviewed: 03/12/2017 Elsevier Patient Education  2021 [employer].

## 2020-12-30 DIAGNOSIS — F331 Major depressive disorder, recurrent, moderate: Secondary | ICD-10-CM | POA: Diagnosis not present

## 2021-01-03 DIAGNOSIS — A749 Chlamydial infection, unspecified: Secondary | ICD-10-CM | POA: Diagnosis not present

## 2021-01-03 DIAGNOSIS — Z113 Encounter for screening for infections with a predominantly sexual mode of transmission: Secondary | ICD-10-CM | POA: Diagnosis not present

## 2021-01-03 DIAGNOSIS — A5900 Urogenital trichomoniasis, unspecified: Secondary | ICD-10-CM | POA: Diagnosis not present

## 2021-01-06 ENCOUNTER — Ambulatory Visit: Payer: BC Managed Care – PPO | Admitting: Nurse Practitioner

## 2021-01-07 DIAGNOSIS — F331 Major depressive disorder, recurrent, moderate: Secondary | ICD-10-CM | POA: Diagnosis not present

## 2021-01-13 DIAGNOSIS — F331 Major depressive disorder, recurrent, moderate: Secondary | ICD-10-CM | POA: Diagnosis not present

## 2021-01-17 DIAGNOSIS — F331 Major depressive disorder, recurrent, moderate: Secondary | ICD-10-CM | POA: Diagnosis not present

## 2021-01-24 ENCOUNTER — Encounter: Payer: Self-pay | Admitting: Nurse Practitioner

## 2021-01-24 ENCOUNTER — Other Ambulatory Visit: Payer: Self-pay

## 2021-01-24 ENCOUNTER — Ambulatory Visit (INDEPENDENT_AMBULATORY_CARE_PROVIDER_SITE_OTHER): Payer: BC Managed Care – PPO | Admitting: Nurse Practitioner

## 2021-01-24 VITALS — BP 126/78 | HR 101 | Temp 98.4°F | Ht 63.0 in | Wt 185.0 lb

## 2021-01-24 DIAGNOSIS — Z0000 Encounter for general adult medical examination without abnormal findings: Secondary | ICD-10-CM

## 2021-01-24 DIAGNOSIS — Z13220 Encounter for screening for lipoid disorders: Secondary | ICD-10-CM

## 2021-01-24 DIAGNOSIS — Z136 Encounter for screening for cardiovascular disorders: Secondary | ICD-10-CM

## 2021-01-24 NOTE — Progress Notes (Signed)
   Subjective:    Patient ID: Cynthia Lawrence, female    DOB: [birth year], 50 y.o.   MRN: [id number]  Ms. Nghiem decided not to complete her visit today because she was offended by me asking her to wear her face mask over her nose and not under her nose. She also was not happy that I recommended colonoscopy or cologuard for colon cancer screen. I  tried explain the reason for the my recommendation and request, but she interrupted me stating, "I am not stupid. I am not connecting with you. I need to be able to trust my primary provider. You are rude and I need to schedule an appointment with someone else" I stated it is was not my intention to offend her nor was I implying that she was stupid... Before I could complete my sentence, she stood up and walked out of the exam room.

## 2021-01-29 DIAGNOSIS — Z7251 High risk heterosexual behavior: Secondary | ICD-10-CM | POA: Diagnosis not present

## 2021-01-29 DIAGNOSIS — N7689 Other specified inflammation of vagina and vulva: Secondary | ICD-10-CM | POA: Diagnosis not present

## 2021-02-01 DIAGNOSIS — F331 Major depressive disorder, recurrent, moderate: Secondary | ICD-10-CM | POA: Diagnosis not present

## 2021-02-15 DIAGNOSIS — F331 Major depressive disorder, recurrent, moderate: Secondary | ICD-10-CM | POA: Diagnosis not present
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
## 2021-03-10 ENCOUNTER — Ambulatory Visit (INDEPENDENT_AMBULATORY_CARE_PROVIDER_SITE_OTHER): Payer: BC Managed Care – PPO | Admitting: Family Medicine

## 2021-03-10 ENCOUNTER — Other Ambulatory Visit: Payer: Self-pay

## 2021-03-10 ENCOUNTER — Ambulatory Visit (INDEPENDENT_AMBULATORY_CARE_PROVIDER_SITE_OTHER): Payer: BC Managed Care – PPO

## 2021-03-10 ENCOUNTER — Ambulatory Visit: Payer: Self-pay

## 2021-03-10 VITALS — BP 122/78 | HR 94 | Ht 63.0 in | Wt 187.6 lb

## 2021-03-10 DIAGNOSIS — G8929 Other chronic pain: Secondary | ICD-10-CM | POA: Diagnosis not present

## 2021-03-10 DIAGNOSIS — M25511 Pain in right shoulder: Secondary | ICD-10-CM

## 2021-03-10 DIAGNOSIS — M25562 Pain in left knee: Secondary | ICD-10-CM

## 2021-03-10 DIAGNOSIS — M545 Low back pain, unspecified: Secondary | ICD-10-CM | POA: Diagnosis not present

## 2021-03-10 NOTE — Patient Instructions (Signed)
Thank you for coming in today.   Call or go to the ER if you develop a large red swollen joint with extreme pain or oozing puss.    Work on core exercises with PT at home.   Please get an Xray today before you leave   Recheck in 6 weeks.   Let me know if this is not working.

## 2021-03-11 NOTE — Progress Notes (Signed)
X-ray lumbar spine looks normal to radiology

## 2021-03-11 NOTE — Progress Notes (Signed)
X-ray right shoulder was normal to radiology

## 2021-03-22 DIAGNOSIS — Z113 Encounter for screening for infections with a predominantly sexual mode of transmission: Secondary | ICD-10-CM | POA: Diagnosis not present

## 2021-03-24 ENCOUNTER — Telehealth (INDEPENDENT_AMBULATORY_CARE_PROVIDER_SITE_OTHER): Payer: BC Managed Care – PPO | Admitting: Physician Assistant

## 2021-03-24 ENCOUNTER — Telehealth: Payer: Self-pay | Admitting: Physician Assistant

## 2021-03-24 ENCOUNTER — Other Ambulatory Visit: Payer: Self-pay

## 2021-03-24 ENCOUNTER — Encounter: Payer: Self-pay | Admitting: Physician Assistant

## 2021-03-24 DIAGNOSIS — F900 Attention-deficit hyperactivity disorder, predominantly inattentive type: Secondary | ICD-10-CM | POA: Diagnosis not present

## 2021-03-24 DIAGNOSIS — F4321 Adjustment disorder with depressed mood: Secondary | ICD-10-CM

## 2021-03-24 DIAGNOSIS — F3113 Bipolar disorder, current episode manic without psychotic features, severe: Secondary | ICD-10-CM | POA: Diagnosis not present

## 2021-03-24 MED ORDER — AMPHETAMINE-DEXTROAMPHETAMINE 5 MG PO TABS: 5.0000 mg | ORAL_TABLET | Freq: Two times a day (BID) | ORAL | 0 refills | Status: DC

## 2021-03-24 MED ORDER — CITALOPRAM HYDROBROMIDE 40 MG PO TABS: 40.0000 mg | ORAL_TABLET | Freq: Every day | ORAL | 1 refills | Status: DC

## 2021-03-24 MED ORDER — AMPHETAMINE-DEXTROAMPHETAMINE 5 MG PO TABS: ORAL_TABLET | ORAL | 0 refills | Status: DC

## 2021-03-24 MED ORDER — CAPLYTA 42 MG PO CAPS: 42.0000 mg | ORAL_CAPSULE | Freq: Every evening | ORAL | 1 refills | Status: DC

## 2021-03-24 NOTE — Progress Notes (Signed)
[facility]  Patient ID: Cynthia Lawrence,  MRN: [id number]  PCP: Anne Ng, NP  Date of Evaluation: 03/24/2021 Time spent:30 minutes  Chief Complaint:  Chief Complaint   ADD; Depression    Virtual Visit via Telehealth  I connected with patient by telephone, with their informed consent, and verified patient privacy and that I am speaking with the correct person using two identifiers.  I am private, in my office and the patient is in her car.  I discussed the limitations, risks, security and privacy concerns of performing an evaluation and management service by telephone and the availability of in person appointments. I also discussed with the patient that there may be a patient responsible charge related to this service.  She states she is in the car in a remote area and unable to have signal for a video.  She was warned that her insurance company may not pay for this visit as it may require video versus telephone visit.  She verbalizes understanding and wants to proceed with the appointment.     I discussed the assessment and treatment plan with the patient. The patient was provided an opportunity to ask questions and all were answered. The patient agreed with the plan and demonstrated an understanding of the instructions.   The patient was advised to call back or seek an in-person evaluation if the symptoms worsen or if the condition fails to improve as anticipated.  I provided 30 minutes of non-face-to-face time during this encounter.   HISTORY/CURRENT STATUS: HPI  Not doing well.  Says she has been manic over the past few months.  Recently she booked a flight to [place] for the following day, went and stayed 4 days.  She has done other impulsive things but does not want to disclose what they are.  Not really having a lot of energy or motivation and sleep has been at her baseline.  Says there have been a few risky behaviors but will not elaborate.  Complains of  more irritability, has a shorter fuse than normal "people are getting on my nerves."  No grandiosity.  No paranoia or hallucinations.  She is able to enjoy things.  Denies decreased energy or motivation.  Appetite has not changed.  No extreme sadness, tearfulness, or feelings of hopelessness.  States she feels that she is running from her problems but that is not helpful.  Not isolating.  No self-harm.  Does have generalized anxiety.  No panic attacks.  Work is fine.  Denies suicidal or homicidal thoughts.  Says that attention has not been as good lately as it has in the past.  She thought about asking for an increased dose of Adderall but states she is not sure about that.  Has a hard time focusing on 1 thing to get finished with that before she is off to something else.  Denies dizziness, syncope, seizures, numbness, tingling, tremor, tics, unsteady gait, slurred speech, confusion.  Denies muscle or joint pain, stiffness, or dystonia.  Individual Medical History/ Review of Systems: Changes? :No    Past medications for mental health diagnoses include: Seroquel, Risperdal, Wellbutrin which worked well for 6 months and then she 'nose-dived' after that. Celexa   Allergies: Patient has no known allergies.  Current Medications:  Current Outpatient Medications:    cetirizine (ZYRTEC) 10 MG tablet, Take 10 mg by mouth daily., Disp: , Rfl:    fluticasone (FLONASE) 50 MCG/ACT nasal spray, Place into both nostrils daily., Disp: , Rfl:  Lumateperone Tosylate (CAPLYTA) 42 MG CAPS, Take 42 mg by mouth every evening., Disp: 30 capsule, Rfl: 1   Omega-3 Fatty Acids (OMEGA 3 500 PO), Take by mouth., Disp: , Rfl:    pyridoxine (B-6) 100 MG tablet, Take 100 mg by mouth daily., Disp: , Rfl:    vitamin B-12 (CYANOCOBALAMIN) 100 MCG tablet, Take 100 mcg by mouth daily., Disp: , Rfl:    albuterol (VENTOLIN HFA) 108 (90 Base) MCG/ACT inhaler, INHALE TWO PUFFS BY MOUTH EVERY 4 TO 6 HOURS AS NEEDED FOR BRONCHOSPASM  (Patient not taking: Reported on 03/24/2021), Disp: , Rfl:    [START ON 05/22/2021] amphetamine-dextroamphetamine (ADDERALL) 5 MG tablet, Take 1 tablet (5 mg total) by mouth 2 (two) times daily with breakfast and lunch., Disp: 60 tablet, Rfl: 0   [START ON 04/22/2021] amphetamine-dextroamphetamine (ADDERALL) 5 MG tablet, TAKE 1 TABLET BY MOUTH TWICE DAILY WITH BREAKFAST AND LUNCH, Disp: 60 tablet, Rfl: 0   amphetamine-dextroamphetamine (ADDERALL) 5 MG tablet, Take 1 tablet (5 mg total) by mouth 2 (two) times daily with breakfast and lunch., Disp: 60 tablet, Rfl: 0   citalopram (CELEXA) 40 MG tablet, Take 1 tablet (40 mg total) by mouth daily., Disp: 90 tablet, Rfl: 1   Nutritional Supplements (JUICE PLUS FIBRE PO), Take by mouth daily. (Patient not taking: No sig reported), Disp: , Rfl:    traZODone (DESYREL) 100 MG tablet, Take 1-2 tablets (100-200 mg total) by mouth at bedtime as needed for sleep. (Patient not taking: No sig reported), Disp: 180 tablet, Rfl: 1 Medication Side Effects: none  Family Medical/ Social History: Changes?  She and her husband still separated.  MENTAL HEALTH EXAM:  There were no vitals taken for this visit.There is no height or weight on file to calculate BMI.  General Appearance:  Unable to assess  Eye Contact:   Unable to assess  Speech:  Clear and Coherent and Normal Rate  Volume:  Normal  Mood:   Sad  Affect:   Unable to assess  Thought Process:  Goal Directed and Descriptions of Associations: Intact  Orientation:  Full (Time, Place, and Person)  Thought Content: Logical   Suicidal Thoughts:  No  Homicidal Thoughts:  No  Memory:  WNL  Judgement:  Fair  Insight:  Good  Psychomotor Activity:   Unable to assess  Concentration:  Concentration: Fair and Attention Span: Fair  Recall:  Good  Fund of Knowledge: Good  Language: Good  Assets:  Desire for Improvement  ADL's:  Intact  Cognition: WNL  Prognosis:  Good    DIAGNOSES:    ICD-10-CM   1. Bipolar  disorder with severe mania (HCC)  F31.13     2. Attention deficit hyperactivity disorder (ADHD), predominantly inattentive type  F90.0     3. Situational depression  F43.21       Receiving Psychotherapy: Yes Danae Orleans.   RECOMMENDATIONS:  PDMP was reviewed.  Last Adderall filled 02/14/2021. I provided 30 minutes of non-face-to-face time during this encounter, including time spent before and after the visit in records review, medical decision making, and charting.  She was told several times before proceeding with the visit that she may have to pay out of pocket if her insurance will not pay because we were doing a phone visit versus a video visit.  Patient stated she would call her insurance company and let them know the circumstances, that she had minimal service for video while driving.  She still wanted to go ahead with the appointment  even though she may have to pay in full. We had a long discussion about her symptoms.  We have discussed her diagnosis in the past, questioning whether she has bipolar disorder or not.  Her symptoms now are classic of bipolar mania and she needs to either be on an antipsychotic or a mood stabilizer.  Lithium could also be effective but we did not discuss that.  She has taken Seroquel and Risperdal in the past and either they did not work or she did not like side effects.  She cannot remember.  I recommend Caplyta because it has a good side effect profile and is still effective as an antipsychotic.  Benefits, risks and side effects were discussed and she accepts those risk.  I will send in a prescription but also give her a 10-day supply of samples along with the co-pay card.  She will pick those up this afternoon. We discussed the stimulant.  It is not smart to increase the Adderall at this point because it is likely going to make the mania symptoms worse.  I will continue the current dose. For now we will continue the Celexa, but it may not be needed in the  future. Start Caplyta 42 mg, 1 p.o. q. evening. Continue Adderall 5 mg, 1 p.o. at breakfast and 1 at lunch. Continue Celexa 40 mg daily. Continue counseling with Danae Orleans. Return in 2 months.  Melony Overly, PA-C

## 2021-03-24 NOTE — Telephone Encounter (Signed)
Ms. bailea, beed are scheduled for a virtual visit with your provider today.    Just as we do with appointments in the office, we must obtain your consent to participate.  Your consent will be active for this visit and any virtual visit you may have with one of our providers in the next 365 days.    If you have a MyChart account, I can also send a copy of this consent to you electronically.  All virtual visits are billed to your insurance company just like a traditional visit in the office.  As this is a virtual visit, video technology does not allow for your provider to perform a traditional examination.  This may limit your provider's ability to fully assess your condition.  If your provider identifies any concerns that need to be evaluated in person or the need to arrange testing such as labs, EKG, etc, we will make arrangements to do so.    Although advances in technology are sophisticated, we cannot ensure that it will always work on either your end or our end.  If the connection with a video visit is poor, we may have to switch to a telephone visit.  With either a video or telephone visit, we are not always able to ensure that we have a secure connection.   I need to obtain your verbal consent now.   Are you willing to proceed with your visit today?   Jyoti Harju has provided verbal consent on 03/24/2021 for a virtual visit (video or telephone).   Melony Overly, PA-C 03/24/2021  11:54 AM

## 2021-03-29 DIAGNOSIS — M25511 Pain in right shoulder: Secondary | ICD-10-CM | POA: Diagnosis not present

## 2021-03-30 DIAGNOSIS — F331 Major depressive disorder, recurrent, moderate: Secondary | ICD-10-CM | POA: Diagnosis not present

## 2021-04-14 DIAGNOSIS — M25511 Pain in right shoulder: Secondary | ICD-10-CM | POA: Diagnosis not present

## 2021-04-19 DIAGNOSIS — M25511 Pain in right shoulder: Secondary | ICD-10-CM | POA: Diagnosis not present

## 2021-04-21 DIAGNOSIS — M25511 Pain in right shoulder: Secondary | ICD-10-CM | POA: Diagnosis not present
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
## 2021-04-26 ENCOUNTER — Ambulatory Visit (INDEPENDENT_AMBULATORY_CARE_PROVIDER_SITE_OTHER): Payer: BC Managed Care – PPO | Admitting: Family Medicine

## 2021-04-26 ENCOUNTER — Encounter: Payer: Self-pay | Admitting: Family Medicine

## 2021-04-26 ENCOUNTER — Other Ambulatory Visit: Payer: Self-pay

## 2021-04-26 VITALS — BP 120/88 | HR 90 | Ht 63.0 in | Wt 181.0 lb

## 2021-04-26 DIAGNOSIS — M6740 Ganglion, unspecified site: Secondary | ICD-10-CM

## 2021-04-26 DIAGNOSIS — M25511 Pain in right shoulder: Secondary | ICD-10-CM

## 2021-04-26 NOTE — Patient Instructions (Addendum)
Thank you for coming in today. Thank you for coming in today.   Do the home exercises for the rhomboid muscle int he shoulder blade area.   Please perform the exercise program that we have prepared for you and gone over in detail on a daily basis.  In addition to the handout you were provided you can access your program through: [email or web address]   Your unique program code is:   [email or web address]  Use a thumb spice splint as needed for the thumb/wrist.   Please use Voltaren gel (Generic Diclofenac Gel) up to 4x daily for pain as needed.  This is available over-the-counter as both the name brand Voltaren gel and the generic diclofenac gel.   I can do an injection at any time.   Recheck as needed.   Botox is just fine.

## 2021-04-28 DIAGNOSIS — M25511 Pain in right shoulder: Secondary | ICD-10-CM | POA: Diagnosis not present

## 2021-05-04 DIAGNOSIS — F331 Major depressive disorder, recurrent, moderate: Secondary | ICD-10-CM | POA: Diagnosis not present

## 2021-05-06 DIAGNOSIS — M25511 Pain in right shoulder: Secondary | ICD-10-CM | POA: Diagnosis not present

## 2021-05-09 DIAGNOSIS — F331 Major depressive disorder, recurrent, moderate: Secondary | ICD-10-CM | POA: Diagnosis not present

## 2021-05-11 DIAGNOSIS — Z7251 High risk heterosexual behavior: Secondary | ICD-10-CM | POA: Diagnosis not present

## 2021-05-11 DIAGNOSIS — N898 Other specified noninflammatory disorders of vagina: Secondary | ICD-10-CM | POA: Diagnosis not present

## 2021-05-16 ENCOUNTER — Other Ambulatory Visit: Payer: Self-pay | Admitting: Physician Assistant

## 2021-05-17 DIAGNOSIS — M25511 Pain in right shoulder: Secondary | ICD-10-CM | POA: Diagnosis not present

## 2021-05-18 DIAGNOSIS — F331 Major depressive disorder, recurrent, moderate: Secondary | ICD-10-CM | POA: Diagnosis not present

## 2021-05-19 DIAGNOSIS — M25511 Pain in right shoulder: Secondary | ICD-10-CM | POA: Diagnosis not present

## 2021-05-25 DIAGNOSIS — F331 Major depressive disorder, recurrent, moderate: Secondary | ICD-10-CM | POA: Diagnosis not present

## 2021-05-26 ENCOUNTER — Telehealth (INDEPENDENT_AMBULATORY_CARE_PROVIDER_SITE_OTHER): Payer: BC Managed Care – PPO | Admitting: Physician Assistant

## 2021-05-26 ENCOUNTER — Encounter: Payer: Self-pay | Admitting: Physician Assistant

## 2021-05-26 DIAGNOSIS — F4310 Post-traumatic stress disorder, unspecified: Secondary | ICD-10-CM | POA: Diagnosis not present

## 2021-05-26 DIAGNOSIS — F900 Attention-deficit hyperactivity disorder, predominantly inattentive type: Secondary | ICD-10-CM | POA: Diagnosis not present

## 2021-05-26 DIAGNOSIS — R682 Dry mouth, unspecified: Secondary | ICD-10-CM

## 2021-05-26 DIAGNOSIS — F319 Bipolar disorder, unspecified: Secondary | ICD-10-CM | POA: Diagnosis not present

## 2021-05-26 MED ORDER — AMPHETAMINE-DEXTROAMPHETAMINE 5 MG PO TABS: ORAL_TABLET | ORAL | 0 refills | Status: DC

## 2021-05-26 MED ORDER — CAPLYTA 42 MG PO CAPS: 42.0000 mg | ORAL_CAPSULE | Freq: Every evening | ORAL | 2 refills | Status: DC

## 2021-05-26 MED ORDER — AMPHETAMINE-DEXTROAMPHETAMINE 5 MG PO TABS: 5.0000 mg | ORAL_TABLET | Freq: Two times a day (BID) | ORAL | 0 refills | Status: DC

## 2021-05-26 NOTE — Progress Notes (Addendum)
[facility]  Patient ID: Cynthia Lawrence,  MRN: [id number]  PCP: Anne Ng, NP  Date of Evaluation: 06/10/2021 Time spent:30 minutes  Chief Complaint:  Chief Complaint   Depression; ADD; Follow-up    Virtual Visit via Telehealth  I connected with patient by a video enabled telemedicine application with their informed consent, and verified patient privacy and that I am speaking with the correct person using two identifiers.  I am private, in my office and [place] the beach.  I discussed the limitations, risks, security and privacy concerns of performing an evaluation and management service by video and the availability of in person appointments. I also discussed with the patient that there may be a patient responsible charge related to this service. The patient expressed understanding and agreed to proceed.   I discussed the assessment and treatment plan with the patient. The patient was provided an opportunity to ask questions and all were answered. The patient agreed with the plan and demonstrated an understanding of the instructions.   The patient was advised to call back or seek an in-person evaluation if the symptoms worsen or if the condition fails to improve as anticipated.  I provided 30  minutes of non-face-to-face time during this encounter.   HISTORY/CURRENT STATUS: HPI for 37-month med check.  At the last visit 2 months ago we started Caplyta.  [name] states it has been a "total again changer."  She feels so much better!  She does wonder if she could increase the dose however, thinks it might work just a tad bit better and she would be exactly where she needs to be.  She is in therapy every week which is helping as well.  Patient denies loss of interest in usual activities and is able to enjoy things.  Is at the beach this week with her family and is enjoying that.  Denies decreased energy or motivation.  Appetite has not changed.  No extreme  sadness, tearfulness, or feelings of hopelessness.  She sleeps really well.  Denies suicidal or homicidal thoughts.  Patient denies increased energy with decreased need for sleep, no increased talkativeness, no racing thoughts, no impulsivity or risky behaviors, no increased spending, no increased libido, no grandiosity, no increased irritability or anger, no paranoia, and no hallucinations.  States that attention is good without easy distractibility.  Able to focus on things and finish tasks to completion.   Review of Systems  Constitutional: Negative.   HENT: Negative.    Eyes: Negative.   Respiratory: Negative.    Cardiovascular: Negative.   Gastrointestinal: Negative.   Genitourinary: Negative.   Musculoskeletal: Negative.   Skin: Negative.   Neurological: Negative.   Endo/Heme/Allergies: Negative.   Psychiatric/Behavioral: Negative.      Individual Medical History/ Review of Systems: Changes? :No    Past medications for mental health diagnoses include: Seroquel, Risperdal, Wellbutrin which worked well for 6 months and then she 'nose-dived' after that. Celexa, Caplyta  Allergies: Patient has no known allergies.  Current Medications:  Current Outpatient Medications:    albuterol (VENTOLIN HFA) 108 (90 Base) MCG/ACT inhaler, INHALE TWO PUFFS BY MOUTH EVERY 4 TO 6 HOURS AS NEEDED FOR BRONCHOSPASM, Disp: , Rfl:    cetirizine (ZYRTEC) 10 MG tablet, Take 10 mg by mouth daily., Disp: , Rfl:    citalopram (CELEXA) 40 MG tablet, Take 1 tablet (40 mg total) by mouth daily., Disp: 90 tablet, Rfl: 1   fluticasone (FLONASE) 50 MCG/ACT nasal spray, Place into  both nostrils daily., Disp: , Rfl:    Omega-3 Fatty Acids (OMEGA 3 500 PO), Take by mouth., Disp: , Rfl:    pyridoxine (B-6) 100 MG tablet, Take 100 mg by mouth daily., Disp: , Rfl:    vitamin B-12 (CYANOCOBALAMIN) 100 MCG tablet, Take 100 mcg by mouth daily., Disp: , Rfl:    [START ON 08/17/2021] amphetamine-dextroamphetamine  (ADDERALL) 5 MG tablet, Take 1 tablet (5 mg total) by mouth 2 (two) times daily with breakfast and lunch., Disp: 60 tablet, Rfl: 0   [START ON 07/18/2021] amphetamine-dextroamphetamine (ADDERALL) 5 MG tablet, TAKE 1 TABLET BY MOUTH TWICE DAILY WITH BREAKFAST AND LUNCH, Disp: 60 tablet, Rfl: 0   [START ON 06/18/2021] amphetamine-dextroamphetamine (ADDERALL) 5 MG tablet, Take 1 tablet (5 mg total) by mouth 2 (two) times daily with breakfast and lunch., Disp: 60 tablet, Rfl: 0   lumateperone tosylate (CAPLYTA) 42 MG capsule, Take 1 capsule (42 mg total) by mouth every evening., Disp: 30 capsule, Rfl: 2   Nutritional Supplements (JUICE PLUS FIBRE PO), Take by mouth daily. (Patient not taking: No sig reported), Disp: , Rfl:  Medication Side Effects: dry mouth this is not too bothersome.  She uses Biotene which helps.  Family Medical/ Social History: Changes?  No  MENTAL HEALTH EXAM:  There were no vitals taken for this visit.There is no height or weight on file to calculate BMI.  General Appearance: Casual and Well Groomed  Eye Contact:  Good  Speech:  Clear and Coherent and Normal Rate  Volume:  Normal  Mood:  Euthymic  Affect:  Appropriate  Thought Process:  Goal Directed and Descriptions of Associations: Intact  Orientation:  Full (Time, Place, and Person)  Thought Content: Logical   Suicidal Thoughts:  No  Homicidal Thoughts:  No  Memory:  WNL  Judgement:  Fair  Insight:  Good  Psychomotor Activity:  Normal  Concentration:  Concentration: Good and Attention Span: Fair  Recall:  Good  Fund of Knowledge: Good  Language: Good  Assets:  Desire for Improvement  ADL's:  Intact  Cognition: WNL  Prognosis:  Good    DIAGNOSES:    ICD-10-CM   1. Bipolar I disorder (HCC)  F31.9     2. Attention deficit hyperactivity disorder (ADHD), predominantly inattentive type  F90.0     3. PTSD (post-traumatic stress disorder)  F43.10     4. Dry mouth  R68.2        Receiving Psychotherapy:  Yes Danae Orleans.   RECOMMENDATIONS:  PDMP was reviewed.  Last Adderall filled 05/20/2021. I provided 30 minutes of non-face-to-face time during this encounter, including time spent before and after the visit in records review, medical decision making, and charting.  I am glad to see her doing so well!  No changes in meds will be made. As far as the dry mouth goes, she is already drinking more water, using Biotene, but I recommend ACT lozenges or gum that helps with dry mouth. Continue Caplyta 42 mg, 1 p.o. q. evening. Continue Adderall 5 mg, 1 p.o. at breakfast and 1 at lunch. Continue Celexa 40 mg daily. Continue counseling with Danae Orleans. Return in 3 months.  Melony Overly, PA-C

## 2021-05-30 DIAGNOSIS — F331 Major depressive disorder, recurrent, moderate: Secondary | ICD-10-CM | POA: Diagnosis not present

## 2021-06-02 DIAGNOSIS — M25511 Pain in right shoulder: Secondary | ICD-10-CM | POA: Diagnosis not present

## 2021-06-02 DIAGNOSIS — F331 Major depressive disorder, recurrent, moderate: Secondary | ICD-10-CM | POA: Diagnosis not present

## 2021-06-07 DIAGNOSIS — M25511 Pain in right shoulder: Secondary | ICD-10-CM | POA: Diagnosis not present

## 2021-06-09 DIAGNOSIS — F331 Major depressive disorder, recurrent, moderate: Secondary | ICD-10-CM | POA: Diagnosis not present

## 2021-06-14 DIAGNOSIS — F331 Major depressive disorder, recurrent, moderate: Secondary | ICD-10-CM | POA: Diagnosis not present

## 2021-06-14 DIAGNOSIS — M25511 Pain in right shoulder: Secondary | ICD-10-CM | POA: Diagnosis not present

## 2021-06-16 DIAGNOSIS — M25511 Pain in right shoulder: Secondary | ICD-10-CM | POA: Diagnosis not present

## 2021-06-23 DIAGNOSIS — F331 Major depressive disorder, recurrent, moderate: Secondary | ICD-10-CM | POA: Diagnosis not present

## 2021-06-29 DIAGNOSIS — M25511 Pain in right shoulder: Secondary | ICD-10-CM | POA: Diagnosis not present

## 2021-07-01 DIAGNOSIS — M25511 Pain in right shoulder: Secondary | ICD-10-CM | POA: Diagnosis not present

## 2021-07-05 DIAGNOSIS — F331 Major depressive disorder, recurrent, moderate: Secondary | ICD-10-CM | POA: Diagnosis not present

## 2021-07-05 DIAGNOSIS — M25511 Pain in right shoulder: Secondary | ICD-10-CM | POA: Diagnosis not present

## 2021-07-06 DIAGNOSIS — Z113 Encounter for screening for infections with a predominantly sexual mode of transmission: Secondary | ICD-10-CM | POA: Diagnosis not present

## 2021-07-06 DIAGNOSIS — Z6835 Body mass index (BMI) 35.0-35.9, adult: Secondary | ICD-10-CM | POA: Diagnosis not present

## 2021-07-06 DIAGNOSIS — Z1231 Encounter for screening mammogram for malignant neoplasm of breast: Secondary | ICD-10-CM | POA: Diagnosis not present

## 2021-07-06 DIAGNOSIS — Z01419 Encounter for gynecological examination (general) (routine) without abnormal findings: Secondary | ICD-10-CM | POA: Diagnosis not present

## 2021-07-06 DIAGNOSIS — Z1211 Encounter for screening for malignant neoplasm of colon: Secondary | ICD-10-CM | POA: Diagnosis not present

## 2021-07-07 DIAGNOSIS — M25511 Pain in right shoulder: Secondary | ICD-10-CM | POA: Diagnosis not present

## 2021-07-07 DIAGNOSIS — Z113 Encounter for screening for infections with a predominantly sexual mode of transmission: Secondary | ICD-10-CM | POA: Diagnosis not present

## 2021-07-07 DIAGNOSIS — Z0000 Encounter for general adult medical examination without abnormal findings: Secondary | ICD-10-CM | POA: Diagnosis not present

## 2021-07-18 ENCOUNTER — Ambulatory Visit (INDEPENDENT_AMBULATORY_CARE_PROVIDER_SITE_OTHER): Payer: BC Managed Care – PPO | Admitting: Family Medicine

## 2021-07-18 ENCOUNTER — Other Ambulatory Visit: Payer: Self-pay

## 2021-07-18 ENCOUNTER — Encounter: Payer: Self-pay | Admitting: Family Medicine

## 2021-07-18 ENCOUNTER — Ambulatory Visit: Payer: Self-pay

## 2021-07-18 VITALS — BP 110/68 | HR 88 | Ht 63.0 in | Wt 175.8 lb

## 2021-07-18 DIAGNOSIS — G8929 Other chronic pain: Secondary | ICD-10-CM

## 2021-07-18 DIAGNOSIS — M25511 Pain in right shoulder: Secondary | ICD-10-CM | POA: Diagnosis not present

## 2021-07-18 DIAGNOSIS — M25562 Pain in left knee: Secondary | ICD-10-CM

## 2021-07-18 NOTE — Progress Notes (Signed)
I, Christoper Fabian, LAT, ATC, am serving as scribe for Dr. [name].  Cynthia Lawrence is a 50 y.o. female who presents to [facility] at [facility] today for f/u of acute-on-chronic L knee pain.  She was last seen by Dr. Denyse Amass on 04/26/21 for R wrist pain and to f/u on R shoulder pain.  Prior to that she was seen on 03/10/21 and 12/06/20 for her L knee.  Today, pt reports L knee pain has flared up since last Wednesday when she was dancing and jumped down off a platform and felt pain in her L knee.  She has been having L knee swelling since but today it's not as bad as previously.  She notes L knee pain stiffness and pain when sitting for an extended period of time.  She has tried Aleve roll-on and a copper knee sleeve.  She denies any L knee mechanical symptoms.  She is also reporting B hip pain since injuring her L knee last Wednesday.  Diagnostic testing: L knee XR- 12/06/20  Pertinent review of systems: No fevers or chills  Relevant historical information: History of concussion.   Exam:  BP 110/68 (BP Location: Left Arm, Patient Position: Sitting, Cuff Size: Normal)   Pulse 88   Ht 5\' 3"  (1.6 m)   Wt 175 lb 12.8 oz (79.7 kg)   SpO2 98%   BMI 31.14 kg/m  General: Well Developed, well nourished, and in no acute distress.   MSK: Left knee mild effusion normal-appearing otherwise. Range of motion: 0-110 degrees. Crepitations with full extension. Tender palpation medial joint line. Stable ligamentous exam Positive McMurray's test. Intact strength.     Lab and Radiology Results  Procedure: Real-time Ultrasound Guided Injection of knee superior lateral patellar space Device: Philips Affiniti 50G Images permanently stored and available for review in PACS Verbal informed consent obtained.  Discussed risks and benefits of procedure. Warned about infection bleeding damage to structures skin hypopigmentation and fat atrophy among others. Patient expresses understanding and  agreement Time-out conducted.   Noted no overlying erythema, induration, or other signs of local infection.   Skin prepped in a sterile fashion.   Local anesthesia: Topical Ethyl chloride.   With sterile technique and under real time ultrasound guidance: 40 mg of Kenalog and 2 mL of lidocaine injected into knee joint. Fluid seen entering the joint capsule.   Completed without difficulty   Pain immediately resolved suggesting accurate placement of the medication.   Advised to call if fevers/chills, erythema, induration, drainage, or persistent bleeding.   Images permanently stored and available for review in the ultrasound unit.  Impression: Technically successful ultrasound guided injection.   EXAM: LEFT KNEE 3 VIEWS   COMPARISON:  None.   FINDINGS: No evidence of fracture, dislocation, or joint effusion. No evidence of arthropathy or other focal bone abnormality. Soft tissues are unremarkable.   IMPRESSION: Negative.     Electronically Signed   By: M.D.   On: 12/06/2020 10:48 I, 12/08/2020, personally (independently) visualized and performed the interpretation of the images attached in this note.       Assessment and Plan: 50 y.o. female with left knee pain.  Patient developed acute left knee pain and swelling after jumping off of a small ledge last week.  Pain thought to be due to exacerbation of a meniscus injury or exacerbation of pre-existing DJD.  This is an exacerbation of a chronic issue with uncertain prognosis.  Plan for steroid injection today  and quad strengthening exercises taught in clinic today by ATC.  Plan to recheck in about a month.  If not improving certainly could refer to physical therapy.  Eventually next step if not better would be MRI for further diagnostic planning and work-up. Discussed prescription strength NSAIDs.  PDMP not reviewed this encounter. Orders Placed This Encounter  Procedures   [place] LIMITED JOINT SPACE STRUCTURES LOW  LEFT(NO LINKED CHARGES)    Order Specific Question:   Reason for Exam (SYMPTOM  OR DIAGNOSIS REQUIRED)    Answer:   L knee pain    Order Specific Question:   Preferred imaging location?    Answer:   [facility]   No orders of the defined types were placed in this encounter.    Discussed warning signs or symptoms. Please see discharge instructions. Patient expresses understanding.   The above documentation has been reviewed and is accurate and complete [name], M.D.

## 2021-07-18 NOTE — Patient Instructions (Addendum)
Good to see you today.  You had a L knee injection.  Call or go to the ER if you develop a large red swollen joint with extreme pain or oozing puss.   Please perform the exercise program that we have prepared for you and gone over in detail on a daily basis.  In addition to the handout you were provided you can access your program through: [email or web address]   Your unique program code is:  [email or web address]  Follow-up in 6 weeks.

## 2021-07-19 DIAGNOSIS — F331 Major depressive disorder, recurrent, moderate: Secondary | ICD-10-CM | POA: Diagnosis not present

## 2021-07-22 DIAGNOSIS — H6503 Acute serous otitis media, bilateral: Secondary | ICD-10-CM | POA: Diagnosis not present

## 2021-07-22 DIAGNOSIS — R051 Acute cough: Secondary | ICD-10-CM | POA: Diagnosis not present

## 2021-07-22 DIAGNOSIS — J209 Acute bronchitis, unspecified: Secondary | ICD-10-CM | POA: Diagnosis not present

## 2021-07-25 DIAGNOSIS — M25511 Pain in right shoulder: Secondary | ICD-10-CM | POA: Diagnosis not present

## 2021-07-27 DIAGNOSIS — M25511 Pain in right shoulder: Secondary | ICD-10-CM | POA: Diagnosis not present

## 2021-08-01 DIAGNOSIS — A609 Anogenital herpesviral infection, unspecified: Secondary | ICD-10-CM | POA: Diagnosis not present

## 2021-08-01 DIAGNOSIS — Z113 Encounter for screening for infections with a predominantly sexual mode of transmission: Secondary | ICD-10-CM | POA: Diagnosis not present

## 2021-08-01 DIAGNOSIS — N766 Ulceration of vulva: Secondary | ICD-10-CM | POA: Diagnosis not present

## 2021-08-02 DIAGNOSIS — F331 Major depressive disorder, recurrent, moderate: Secondary | ICD-10-CM | POA: Diagnosis not present

## 2021-08-03 DIAGNOSIS — R899 Unspecified abnormal finding in specimens from other organs, systems and tissues: Secondary | ICD-10-CM | POA: Diagnosis not present

## 2021-08-03 DIAGNOSIS — A609 Anogenital herpesviral infection, unspecified: Secondary | ICD-10-CM | POA: Diagnosis not present

## 2021-08-05 DIAGNOSIS — M25511 Pain in right shoulder: Secondary | ICD-10-CM | POA: Diagnosis not present

## 2021-08-08 DIAGNOSIS — M25511 Pain in right shoulder: Secondary | ICD-10-CM | POA: Diagnosis not present

## 2021-08-09 NOTE — Progress Notes (Signed)
   I, Christoper Fabian, LAT, ATC, am serving as scribe for Dr. [name].   Cynthia Lawrence is a 50 y.o. female who presents to [facility] at [facility] today for f/u of acute-on-chronic L knee pain that flared up while dancing.  She was last seen by Dr. Denyse Amass on 07/18/21 and had a L knee injection.  She was also shown a HEP focusing on quad and hip aBd/glute strengthening.  Today, pt reports her L knee pretty painful and not much relief from the steroid injection. Pt notes she has been working out w/ a Systems analyst. Pt reports increased pain since Saturday and having a hard time getting around. Pt is nervous for her upcoming holiday travels for Thanksgiving and being in pain and unable to walk. She is taking ibuprofen and Tylenol which helps some. Diagnostic testing: L knee XR- 12/06/20  Pertinent review of systems: No fevers or chills  Relevant historical information: History of concussion.   Exam:  BP 118/80   Pulse 96   Ht 5\' 3"  (1.6 m)   Wt 176 lb (79.8 kg)   SpO2 97%   BMI 31.18 kg/m  General: Well Developed, well nourished, and in no acute distress.   MSK: Left knee mild effusion normal-appearing otherwise. Normal motion.  Tender palpation medial and lateral joint line. Crepitation with motion. Positive McMurray's test.   Lab and Radiology Results  X-ray images obtained December 06, 2020 show minimal degenerative changes medial joint line.  New x-ray ordered today will be obtained tomorrow due to x-ray technician availability.   Assessment and Plan: 50 y.o. female with left knee pain after a slight injury occurring about 3 weeks ago.  Patient has not had sufficient benefit from an intra-articular steroid injection on October 24.  Concern for degenerative meniscus tear or exacerbation of DJD worse than previously understood based on x-ray from March.  Plan to repeat x-ray to look for potential injury occurring 3 weeks ago and to evaluate for potential  worsening DJD.  Additionally we will work on authorization for hyaluronic acid injections which should be our next step.  If this is not sufficient to control pain likely next step will be MRI.   PDMP not reviewed this encounter. Orders Placed This Encounter  Procedures   DG Knee AP/LAT W/Sunrise Left    Please comment and specify on any signs of arthritis as would be helpful for authorization of hyaluronic acid injections.    Standing Status:   Future    Standing Expiration Date:   08/10/2022    Order Specific Question:   Reason for Exam (SYMPTOM  OR DIAGNOSIS REQUIRED)    Answer:   left knee pain    Order Specific Question:   Preferred imaging location?    Answer:   08/12/2022    Order Specific Question:   Is patient pregnant?    Answer:   No   No orders of the defined types were placed in this encounter.    Discussed warning signs or symptoms. Please see discharge instructions. Patient expresses understanding.   The above documentation has been reviewed and is accurate and complete Kyra Searles, M.D.

## 2021-08-10 ENCOUNTER — Other Ambulatory Visit: Payer: Self-pay

## 2021-08-10 ENCOUNTER — Ambulatory Visit (INDEPENDENT_AMBULATORY_CARE_PROVIDER_SITE_OTHER): Payer: BC Managed Care – PPO | Admitting: Family Medicine

## 2021-08-10 VITALS — BP 118/80 | HR 96 | Ht 63.0 in | Wt 176.0 lb

## 2021-08-10 DIAGNOSIS — M25562 Pain in left knee: Secondary | ICD-10-CM

## 2021-08-10 DIAGNOSIS — G8929 Other chronic pain: Secondary | ICD-10-CM

## 2021-08-10 NOTE — Patient Instructions (Addendum)
Thank you for coming in today.   We will work to get the gel shots authorized  Please come back to the office tomorrow to get a new xray of your knee  You will hear from our office about scheduling once the gel shots are approved.

## 2021-08-11 DIAGNOSIS — M25511 Pain in right shoulder: Secondary | ICD-10-CM | POA: Diagnosis not present

## 2021-08-16 ENCOUNTER — Ambulatory Visit (INDEPENDENT_AMBULATORY_CARE_PROVIDER_SITE_OTHER): Payer: BC Managed Care – PPO

## 2021-08-16 DIAGNOSIS — G8929 Other chronic pain: Secondary | ICD-10-CM | POA: Diagnosis not present

## 2021-08-16 DIAGNOSIS — M25562 Pain in left knee: Secondary | ICD-10-CM | POA: Diagnosis not present

## 2021-08-17 NOTE — Progress Notes (Signed)
Left knee x-ray shows mild arthritis.

## 2021-08-22 ENCOUNTER — Ambulatory Visit: Payer: BC Managed Care – PPO | Admitting: Family Medicine

## 2021-08-24 ENCOUNTER — Telehealth: Payer: Self-pay | Admitting: Family Medicine

## 2021-08-24 DIAGNOSIS — G8929 Other chronic pain: Secondary | ICD-10-CM

## 2021-08-24 DIAGNOSIS — M25562 Pain in left knee: Secondary | ICD-10-CM

## 2021-08-24 DIAGNOSIS — M1712 Unilateral primary osteoarthritis, left knee: Secondary | ICD-10-CM

## 2021-08-24 MED ORDER — ORTHOVISC 30 MG/2ML IX SOSY: 2.0000 mL | PREFILLED_SYRINGE | INTRA_ARTICULAR | 0 refills | Status: DC

## 2021-08-24 NOTE — Telephone Encounter (Signed)
-----   Message from Adron Bene sent at 08/24/2021  2:08 PM EST ----- Regarding: RE: gel shots Pt's insurance did not approve any of the gel shots for the "buy and bill" option.  Luanna Cole advised to send a prescription, of whichever is your preferred product, to the pt's pharmacy as a last effort to get approval. If prior authorization is required, she will receive a prompt.  Dorathy Daft ----- Message ----- From: Rodolph Bong, MD Sent: 08/10/2021   9:09 AM EST To: Adron Bene Subject: gel shots                                      Please auth gel shots left knee once we get xray back

## 2021-08-24 NOTE — Telephone Encounter (Signed)
Left pt a VM explaining the a rx has been sent to her pharmacy and to let us know if there is an issue.

## 2021-08-24 NOTE — Telephone Encounter (Signed)
Your insurance wants [place] to send a prescription of the gel shots to your pharmacy. I sent Orthovisc in.  Please let me know if there is a problem.

## 2021-08-30 ENCOUNTER — Telehealth: Payer: Self-pay | Admitting: Family Medicine

## 2021-08-30 DIAGNOSIS — M25511 Pain in right shoulder: Secondary | ICD-10-CM | POA: Diagnosis not present

## 2021-08-30 MED ORDER — GELSYN-3 16.8 MG/2ML IX SOSY
16.8000 mg | PREFILLED_SYRINGE | INTRA_ARTICULAR | 0 refills | Status: AC
Start: 2021-08-30 — End: ?

## 2021-08-30 NOTE — Telephone Encounter (Signed)
Gelsyn-3 sent to pharmacy

## 2021-08-30 NOTE — Telephone Encounter (Signed)
-----   Message from Adron Bene sent at 08/25/2021  2:20 PM EST ----- Regarding: Cynthia Lawrence is the preferred gel injection for this pt's insurance. Please send this rx order to the pt's insurance company.

## 2021-09-06 ENCOUNTER — Telehealth: Payer: Self-pay

## 2021-09-06 DIAGNOSIS — M25511 Pain in right shoulder: Secondary | ICD-10-CM | POA: Diagnosis not present

## 2021-09-06 NOTE — Telephone Encounter (Signed)
Pt was called to inform her Cynthia Lawrence was sent to her pharmacy and should be available for pickup. Pt reports that the cost was $1700 and that her insurance wasn't going to cover it. Pt was advised to call the pharmacy to see if which gel injection this was for. Pt advised to call our office back to let us know the status.

## 2021-09-12 ENCOUNTER — Other Ambulatory Visit: Payer: Self-pay

## 2021-09-12 ENCOUNTER — Ambulatory Visit (INDEPENDENT_AMBULATORY_CARE_PROVIDER_SITE_OTHER): Payer: BC Managed Care – PPO | Admitting: Sports Medicine

## 2021-09-12 VITALS — BP 120/80 | HR 57 | Ht 63.0 in | Wt 179.0 lb

## 2021-09-12 DIAGNOSIS — M940 Chondrocostal junction syndrome [Tietze]: Secondary | ICD-10-CM | POA: Diagnosis not present

## 2021-09-12 MED ORDER — MELOXICAM 15 MG PO TABS: 15.0000 mg | ORAL_TABLET | Freq: Every day | ORAL | 0 refills | Status: DC

## 2021-09-12 NOTE — Patient Instructions (Addendum)
Good to see you  Meloxicam 15mg  daily 2 weeks  As needed follow up

## 2021-09-12 NOTE — Progress Notes (Signed)
Cynthia Lawrence D.[facility] [address] [place] [address] Phone: [phone]   Assessment and Plan:     1. Costochondritis -Acute, uncomplicated, initial sports medicine visit - Likely costochondritis of bilateral rib cage after significant workout as well as strain of left oblique based on HPI, physical exam - No red flag, systemic symptoms.  No imaging performed today - Generalized rest for the next 1 to 2 weeks - Start meloxicam 15 mg daily x2 weeks - Slowly advance activities as tolerated   Pertinent previous records reviewed include none   Follow Up: As needed if no improvement in 2 to 4 weeks.  Could consider x-ray at that time   Subjective:   I, Cynthia Lawrence, am serving as a Neurosurgeon for Doctor Richardean Sale  Chief Complaint: rib pain   HPI:   09/12/21 Patient is a 50 year old female complaining of rib pain . Patient states that ribs are hurting bilaterally, has been working with trainer did a tough workout last Wednesday trainer added weight to squat machine and felt the strain on ribs,tylenol and ib didn't work has trouble staying asleep the past three nights , usually hurts more on the L but today right pain started. Hurts move 8/10 pain level sometimes hurts to take a breath   Relevant Historical Information: None pertinent  Additional pertinent review of systems negative.   Current Outpatient Medications:    albuterol (VENTOLIN HFA) 108 (90 Base) MCG/ACT inhaler, INHALE TWO PUFFS BY MOUTH EVERY 4 TO 6 HOURS AS NEEDED FOR BRONCHOSPASM, Disp: , Rfl:    amphetamine-dextroamphetamine (ADDERALL) 5 MG tablet, Take 1 tablet (5 mg total) by mouth 2 (two) times daily with breakfast and lunch., Disp: 60 tablet, Rfl: 0   amphetamine-dextroamphetamine (ADDERALL) 5 MG tablet, TAKE 1 TABLET BY MOUTH TWICE DAILY WITH BREAKFAST AND LUNCH, Disp: 60 tablet, Rfl: 0   amphetamine-dextroamphetamine (ADDERALL) 5 MG tablet, Take 1 tablet (5  mg total) by mouth 2 (two) times daily with breakfast and lunch., Disp: 60 tablet, Rfl: 0   cetirizine (ZYRTEC) 10 MG tablet, Take 10 mg by mouth daily., Disp: , Rfl:    citalopram (CELEXA) 40 MG tablet, Take 1 tablet (40 mg total) by mouth daily., Disp: 90 tablet, Rfl: 1   fluticasone (FLONASE) 50 MCG/ACT nasal spray, Place into both nostrils daily., Disp: , Rfl:    lumateperone tosylate (CAPLYTA) 42 MG capsule, Take 1 capsule (42 mg total) by mouth every evening., Disp: 30 capsule, Rfl: 2   meloxicam (MOBIC) 15 MG tablet, Take 1 tablet (15 mg total) by mouth daily., Disp: 30 tablet, Rfl: 0   Nutritional Supplements (JUICE PLUS FIBRE PO), Take by mouth daily., Disp: , Rfl:    Omega-3 Fatty Acids (OMEGA 3 500 PO), Take by mouth., Disp: , Rfl:    pyridoxine (B-6) 100 MG tablet, Take 100 mg by mouth daily., Disp: , Rfl:    Sodium Hyaluronate, [place]os[place], (GELSYN-3) 16.8 MG/2ML SOSY, Inject 16.8 mg into the articular space once a week., Disp: 6.09 mL, Rfl: 0   vitamin B-12 (CYANOCOBALAMIN) 100 MCG tablet, Take 100 mcg by mouth daily., Disp: , Rfl:    Objective:     Vitals:   09/12/21 1524  BP: 120/80  Pulse: (!) 57  SpO2: 97%  Weight: 179 lb (81.2 kg)  Height: 5\' 3"  (1.6 m)      Body mass index is 31.71 kg/m.    Physical Exam:    General: Well-appearing, cooperative,  sitting comfortably in no acute distress.  HEENT: Normocephalic, atraumatic.   Neck: No gross abnormality.  Cardiovascular: No pallor or cyanosis. Resp: Comfortable WOB.  No flail chest Abdomen: Non distended.  TTP bilateral ribs 6-8 laterally.  Pain along left obliques with left truncal rotation Skin: Warm and dry; no focal rashes identified on limited exam. Extremities: No cyanosis or edema.  Neuro: Gross motor and sensory intact. Gait normal. Psychiatric: Mood and affect are appropriate.   Neuro: CN II-XII intact; strength and sensation intact generally, reflexes 2+ in upper and lower extremities     Electronically  signed by:  Cynthia Lawrence D.[facility] 4:43 PM 09/12/21

## 2021-09-13 DIAGNOSIS — M25511 Pain in right shoulder: Secondary | ICD-10-CM | POA: Diagnosis not present

## 2021-09-14 DIAGNOSIS — F331 Major depressive disorder, recurrent, moderate: Secondary | ICD-10-CM | POA: Diagnosis not present

## 2021-09-20 DIAGNOSIS — Z7251 High risk heterosexual behavior: Secondary | ICD-10-CM | POA: Diagnosis not present

## 2021-09-20 DIAGNOSIS — R109 Unspecified abdominal pain: Secondary | ICD-10-CM | POA: Diagnosis not present

## 2021-09-20 DIAGNOSIS — S39011A Strain of muscle, fascia and tendon of abdomen, initial encounter: Secondary | ICD-10-CM | POA: Diagnosis not present

## 2021-09-22 ENCOUNTER — Ambulatory Visit: Payer: BC Managed Care – PPO | Admitting: Family Medicine

## 2021-09-22 NOTE — Progress Notes (Deleted)
° °  I, Cynthia Lawrence, LAT, ATC acting as a scribe for Cynthia Graham, MD.  Cynthia Lawrence is a 50 y.o. female who presents to [facility] at [facility] today for f/u costochondritis of bilateral rib cage after significant workout as well as strain of left oblique. Pt was last seen by Dr. Jean Rosenthal on 09/12/21 and was prescribed meloxicam and advised to rest for 1-2 weeks. Today, pt reports   Pertinent review of systems: ***  Relevant historical information: ***   Exam:  There were no vitals taken for this visit. General: Well Developed, well nourished, and in no acute distress.   MSK: ***    Lab and Radiology Results No results found for this or any previous visit (from the past 72 hour(s)). No results found.     Assessment and Plan: 50 y.o. female with ***   PDMP not reviewed this encounter. No orders of the defined types were placed in this encounter.  No orders of the defined types were placed in this encounter.    Discussed warning signs or symptoms. Please see discharge instructions. Patient expresses understanding.   ***

## 2021-09-23 ENCOUNTER — Other Ambulatory Visit: Payer: Self-pay | Admitting: Physician Assistant

## 2021-10-03 DIAGNOSIS — F331 Major depressive disorder, recurrent, moderate: Secondary | ICD-10-CM | POA: Diagnosis not present

## 2021-10-05 DIAGNOSIS — M25511 Pain in right shoulder: Secondary | ICD-10-CM | POA: Diagnosis not present

## 2021-10-08 DIAGNOSIS — S39011A Strain of muscle, fascia and tendon of abdomen, initial encounter: Secondary | ICD-10-CM | POA: Diagnosis not present

## 2021-10-08 DIAGNOSIS — N390 Urinary tract infection, site not specified: Secondary | ICD-10-CM | POA: Diagnosis not present

## 2021-10-08 DIAGNOSIS — R109 Unspecified abdominal pain: Secondary | ICD-10-CM | POA: Diagnosis not present

## 2021-10-08 DIAGNOSIS — Z7251 High risk heterosexual behavior: Secondary | ICD-10-CM | POA: Diagnosis not present

## 2021-10-11 DIAGNOSIS — M25511 Pain in right shoulder: Secondary | ICD-10-CM | POA: Diagnosis not present

## 2021-10-19 DIAGNOSIS — M25511 Pain in right shoulder: Secondary | ICD-10-CM | POA: Diagnosis not present

## 2021-10-26 DIAGNOSIS — M25511 Pain in right shoulder: Secondary | ICD-10-CM | POA: Diagnosis not present

## 2021-10-31 ENCOUNTER — Other Ambulatory Visit: Payer: Self-pay | Admitting: Physician Assistant

## 2021-10-31 NOTE — Telephone Encounter (Signed)
Pt has new insurance this year- She cannot use [facility], She has to use [facility] now. Please send Caplyta and Celexa and [facility] [address]

## 2021-11-01 NOTE — Telephone Encounter (Signed)
Please schedule appt

## 2021-11-02 NOTE — Telephone Encounter (Signed)
Pt has an appt 2/22. She also needs her adderall filled. Her pharmacy has it in stock

## 2021-11-03 MED ORDER — AMPHETAMINE-DEXTROAMPHETAMINE 5 MG PO TABS: 5.0000 mg | ORAL_TABLET | Freq: Two times a day (BID) | ORAL | 0 refills | Status: DC

## 2021-11-04 ENCOUNTER — Other Ambulatory Visit: Payer: Self-pay | Admitting: Physician Assistant

## 2021-11-04 MED ORDER — AMPHETAMINE-DEXTROAMPHETAMINE 5 MG PO TABS: 5.0000 mg | ORAL_TABLET | Freq: Two times a day (BID) | ORAL | 0 refills | Status: DC

## 2021-11-04 MED ORDER — CAPLYTA 42 MG PO CAPS: 42.0000 mg | ORAL_CAPSULE | Freq: Every evening | ORAL | 0 refills | Status: DC

## 2021-11-04 NOTE — Telephone Encounter (Signed)
Cynthia Lawrence called because her Adderall prescriptionn was sent to the wrong pharmacy.  She requested a new pharmacy and requested the prescription be sent to the new pharmacy.  Please [facility] on [address].  They did have it when she requested it and she is now out of the medication.

## 2021-11-07 NOTE — Telephone Encounter (Signed)
See message from pharmacy.

## 2021-11-09 ENCOUNTER — Telehealth: Payer: Self-pay

## 2021-11-09 DIAGNOSIS — R69 Illness, unspecified: Secondary | ICD-10-CM | POA: Diagnosis not present

## 2021-11-09 NOTE — Telephone Encounter (Signed)
PA is pending.

## 2021-11-09 NOTE — Telephone Encounter (Signed)
Prior Authorization initiated and submitted for CAPLYTA 42 MG. With [employer]. Pending response.

## 2021-11-10 NOTE — Telephone Encounter (Signed)
Prior Approval received for Caplyta 42 mg effective 11/10/2021-11/10/2022 with [employer] ID# [id number]

## 2021-11-10 NOTE — Telephone Encounter (Signed)
Prior approval received 11/10/2021

## 2021-11-16 ENCOUNTER — Other Ambulatory Visit: Payer: Self-pay

## 2021-11-16 ENCOUNTER — Ambulatory Visit (INDEPENDENT_AMBULATORY_CARE_PROVIDER_SITE_OTHER): Payer: BC Managed Care – PPO | Admitting: Physician Assistant

## 2021-11-16 ENCOUNTER — Encounter: Payer: Self-pay | Admitting: Physician Assistant

## 2021-11-16 DIAGNOSIS — F5102 Adjustment insomnia: Secondary | ICD-10-CM

## 2021-11-16 DIAGNOSIS — F4310 Post-traumatic stress disorder, unspecified: Secondary | ICD-10-CM

## 2021-11-16 DIAGNOSIS — F319 Bipolar disorder, unspecified: Secondary | ICD-10-CM

## 2021-11-16 DIAGNOSIS — F900 Attention-deficit hyperactivity disorder, predominantly inattentive type: Secondary | ICD-10-CM

## 2021-11-16 MED ORDER — AMPHETAMINE-DEXTROAMPHETAMINE 5 MG PO TABS: 5.0000 mg | ORAL_TABLET | Freq: Two times a day (BID) | ORAL | 0 refills | Status: DC

## 2021-11-16 MED ORDER — CAPLYTA 42 MG PO CAPS: 42.0000 mg | ORAL_CAPSULE | Freq: Every evening | ORAL | 5 refills | Status: DC

## 2021-11-16 MED ORDER — CITALOPRAM HYDROBROMIDE 40 MG PO TABS: 40.0000 mg | ORAL_TABLET | Freq: Every day | ORAL | 1 refills | Status: DC

## 2021-11-16 MED ORDER — AMPHETAMINE-DEXTROAMPHETAMINE 5 MG PO TABS: ORAL_TABLET | ORAL | 0 refills | Status: DC

## 2021-11-16 NOTE — Progress Notes (Signed)
[facility]  Patient ID: Cynthia Lawrence,  MRN: [id number]  PCP: Anne Ng, NP  Date of Evaluation: 11/16/2021 Time spent:30 minutes  Chief Complaint:  Chief Complaint   ADD; Anxiety; Depression; Follow-up     HISTORY/CURRENT STATUS: HPI for routine med check.  Cynthia Lawrence is doing well, feels that the medications are working well.  The Caplyta has been very helpful with keeping her moods level.  Work is going well, she is a Veterinary surgeon.  She is sleeping well most of the time.  States that attention is good without easy distractibility.  Able to focus on things and finish tasks to completion.   Patient denies loss of interest in usual activities and is able to enjoy things.  Denies decreased energy or motivation.  Appetite has not changed.  No extreme sadness, tearfulness, or feelings of hopelessness.  ADLs and personal hygiene are normal.  Does have anxiety at times, depends on the day and what is going on.  Feels that she is able to work through things though, admits to using pot recreationally.  Not daily by any means, it does help her feel better and more relaxed.  Able to sleep better.  Also used it recently for migraine and it went away.  Asks if it is safe to take.  Denies suicidal or homicidal thoughts.  Patient denies increased energy with decreased need for sleep, no increased talkativeness, no racing thoughts, no impulsivity or risky behaviors, no increased spending, no increased libido, no grandiosity, no increased irritability or anger, no paranoia, and no hallucinations.  Denies dizziness, syncope, seizures, numbness, tingling, tremor, tics, unsteady gait, slurred speech, confusion. Denies muscle or joint pain, stiffness, or dystonia. Denies unexplained weight loss, frequent infections, or sores that heal slowly.  No polyphagia, polydipsia, or polyuria. Denies visual changes or paresthesias.  Had labs in October at her GYN visit, [facility].   Individual Medical History/ Review of Systems: Changes? :No    Past medications for mental health diagnoses include: Seroquel, Risperdal, Wellbutrin which worked well for 6 months and then she 'nose-dived' after that. Celexa, Caplyta  Allergies: Patient has no known allergies.  Current Medications:  Current Outpatient Medications:    albuterol (VENTOLIN HFA) 108 (90 Base) MCG/ACT inhaler, INHALE TWO PUFFS BY MOUTH EVERY 4 TO 6 HOURS AS NEEDED FOR BRONCHOSPASM, Disp: , Rfl:    amphetamine-dextroamphetamine (ADDERALL) 5 MG tablet, Take 1 tablet (5 mg total) by mouth 2 (two) times daily with breakfast and lunch., Disp: 60 tablet, Rfl: 0   cetirizine (ZYRTEC) 10 MG tablet, Take 10 mg by mouth daily., Disp: , Rfl:    fluticasone (FLONASE) 50 MCG/ACT nasal spray, Place into both nostrils daily., Disp: , Rfl:    Nutritional Supplements (JUICE PLUS FIBRE PO), Take by mouth daily., Disp: , Rfl:    Omega-3 Fatty Acids (OMEGA 3 500 PO), Take by mouth., Disp: , Rfl:    pyridoxine (B-6) 100 MG tablet, Take 100 mg by mouth daily., Disp: , Rfl:    valACYclovir (VALTREX) 1000 MG tablet, SMARTSIG:1 Tablet(s) By Mouth Every 12 Hours, Disp: , Rfl:    vitamin B-12 (CYANOCOBALAMIN) 100 MCG tablet, Take 100 mcg by mouth daily., Disp: , Rfl:    [START ON 12/05/2021] amphetamine-dextroamphetamine (ADDERALL) 5 MG tablet, TAKE 1 TABLET BY MOUTH TWICE DAILY WITH BREAKFAST AND LUNCH, Disp: 60 tablet, Rfl: 0   [START ON 01/04/2022] amphetamine-dextroamphetamine (ADDERALL) 5 MG tablet, Take 1 tablet (5 mg total) by mouth 2 (two) times daily  with breakfast and lunch., Disp: 60 tablet, Rfl: 0   citalopram (CELEXA) 40 MG tablet, Take 1 tablet (40 mg total) by mouth daily., Disp: 90 tablet, Rfl: 1   lumateperone tosylate (CAPLYTA) 42 MG capsule, Take 1 capsule (42 mg total) by mouth every evening., Disp: 30 capsule, Rfl: 5   meloxicam (MOBIC) 15 MG tablet, Take 1 tablet (15 mg total) by mouth daily. (Patient not  taking: Reported on 11/16/2021), Disp: 30 tablet, Rfl: 0   Sodium Hyaluronate, Viscosup, (GELSYN-3) 16.8 MG/2ML SOSY, Inject 16.8 mg into the articular space once a week., Disp: 6.09 mL, Rfl: 0 Medication Side Effects: dry mouth this is not too bothersome.  She uses Biotene which helps.  Family Medical/ Social History: Changes?  No  MENTAL HEALTH EXAM:  There were no vitals taken for this visit.There is no height or weight on file to calculate BMI.  General Appearance: Casual and Well Groomed  Eye Contact:  Good  Speech:  Clear and Coherent and Normal Rate  Volume:  Normal  Mood:  Euthymic  Affect:  Appropriate  Thought Process:  Goal Directed and Descriptions of Associations: Intact  Orientation:  Full (Time, Place, and Person)  Thought Content: Logical   Suicidal Thoughts:  No  Homicidal Thoughts:  No  Memory:  WNL  Judgement:  Fair  Insight:  Good  Psychomotor Activity:  Normal  Concentration:  Concentration: Good and Attention Span: Good  Recall:  Good  Fund of Knowledge: Good  Language: Good  Assets:  Desire for Improvement  ADL's:  Intact  Cognition: WNL  Prognosis:  Good    DIAGNOSES:    ICD-10-CM   1. Bipolar I disorder (HCC)  F31.9     2. Attention deficit hyperactivity disorder (ADHD), predominantly inattentive type  F90.0     3. PTSD (post-traumatic stress disorder)  F43.10     4. Adjustment insomnia  F51.02        Receiving Psychotherapy: Yes   hypnosis therapy and meditation   RECOMMENDATIONS:  PDMP was reviewed.  Last Adderall filled 11/08/2021. I provided 30 minutes of face to face time during this encounter, including time spent before and after the visit in records review, medical decision making, counseling pertinent to today's visit, and charting.  She is doing well so no changes in meds are necessary. I am unable to write a letter for an emotional support animal, unless it is trained for service/emotional support. I cannot recommend pot or CBD,  the first is illegal, and the second is not FDA approved.  Continue Caplyta 42 mg, 1 p.o. q. evening. Continue Adderall 5 mg, 1 p.o. at breakfast and 1 at lunch. Continue Celexa 40 mg daily. Continue counseling. ROI for labs and progress notes from [facility].  From 2022  Return in 6 months  Melony Overly, [place]

## 2021-11-19 DIAGNOSIS — R609 Edema, unspecified: Secondary | ICD-10-CM | POA: Diagnosis not present

## 2021-11-19 DIAGNOSIS — R509 Fever, unspecified: Secondary | ICD-10-CM | POA: Diagnosis not present

## 2021-11-20 ENCOUNTER — Other Ambulatory Visit: Payer: Self-pay

## 2021-11-20 ENCOUNTER — Emergency Department (HOSPITAL_COMMUNITY)
Admission: EM | Admit: 2021-11-20 | Discharge: 2021-11-20 | Disposition: A | Discharge status: Home / Self Care | Payer: 59 | Attending: Emergency Medicine | Admitting: Emergency Medicine

## 2021-11-20 ENCOUNTER — Encounter (HOSPITAL_COMMUNITY): Payer: Self-pay

## 2021-11-20 DIAGNOSIS — H5713 Ocular pain, bilateral: Secondary | ICD-10-CM

## 2021-11-20 DIAGNOSIS — H11423 Conjunctival edema, bilateral: Secondary | ICD-10-CM | POA: Diagnosis not present

## 2021-11-20 DIAGNOSIS — H109 Unspecified conjunctivitis: Secondary | ICD-10-CM

## 2021-11-20 DIAGNOSIS — Z7951 Long term (current) use of inhaled steroids: Secondary | ICD-10-CM | POA: Diagnosis not present

## 2021-11-20 DIAGNOSIS — H1033 Unspecified acute conjunctivitis, bilateral: Secondary | ICD-10-CM | POA: Diagnosis not present

## 2021-11-20 DIAGNOSIS — H11433 Conjunctival hyperemia, bilateral: Secondary | ICD-10-CM | POA: Diagnosis not present

## 2021-11-20 DIAGNOSIS — J45909 Unspecified asthma, uncomplicated: Secondary | ICD-10-CM | POA: Diagnosis not present

## 2021-11-20 DIAGNOSIS — H02841 Edema of right upper eyelid: Secondary | ICD-10-CM | POA: Diagnosis not present

## 2021-11-20 DIAGNOSIS — H02844 Edema of left upper eyelid: Secondary | ICD-10-CM | POA: Diagnosis not present

## 2021-11-20 DIAGNOSIS — H11823 Conjunctivochalasis, bilateral: Secondary | ICD-10-CM | POA: Diagnosis not present

## 2021-11-20 DIAGNOSIS — H1013 Acute atopic conjunctivitis, bilateral: Secondary | ICD-10-CM | POA: Diagnosis not present

## 2021-11-20 DIAGNOSIS — H53143 Visual discomfort, bilateral: Secondary | ICD-10-CM | POA: Diagnosis not present

## 2021-11-20 MED ORDER — ERYTHROMYCIN 5 MG/GM OP OINT
TOPICAL_OINTMENT | Freq: Once | OPHTHALMIC | Status: AC
  Filled 2021-11-20: qty 3.5

## 2021-11-20 MED ORDER — TETRACAINE HCL 0.5 % OP SOLN
2.0000 [drp] | Freq: Once | OPHTHALMIC | Status: AC
  Administered 2021-11-20: 2 [drp] via OPHTHALMIC
  Filled 2021-11-20: qty 4

## 2021-11-20 MED ORDER — KETOROLAC TROMETHAMINE 15 MG/ML IJ SOLN
15.0000 mg | Freq: Once | INTRAMUSCULAR | Status: AC
  Administered 2021-11-20: 15 mg via INTRAVENOUS
  Filled 2021-11-20: qty 1

## 2021-11-20 MED ORDER — FLUORESCEIN SODIUM 1 MG OP STRP
1.0000 | ORAL_STRIP | Freq: Once | OPHTHALMIC | Status: AC
  Administered 2021-11-20: 1 via OPHTHALMIC
  Filled 2021-11-20: qty 1

## 2021-11-20 NOTE — Discharge Instructions (Signed)
You were seen in the emergency department tonight for eye pain and drainage.  We have given you erythromycin ophthalmic ointment, please apply this to both eyes 5 times per day.  We spoke with Dr. Allena Katz, the on-call ophthalmologist, he is planning to see you in clinic this afternoon, we have provided him with your contact information and have also provided his office information in your discharge instructions.  Please be sure to follow-up.  Return to the emergency department for new or worsening symptoms including but not limited to new or worsening pain, change in your vision, loss of vision, trouble walking, numbness, weakness, change in your speech, or any other concerns.

## 2021-11-20 NOTE — ED Triage Notes (Addendum)
Patient BIB EMS, pt reports she woke up on Monday with itchy eyes and a migraine. Seen at [facility] on Wednesday and diagnosed with sinus infection. Pt states she has not had relief of symptoms since Monday. Pt noted to have swollen and red eyes. Denies SOB or difficulty breathing.

## 2021-11-20 NOTE — ED Provider Notes (Signed)
[facility]-EMERGENCY DEPT Provider Note   CSN: [id number] Arrival date & time: 11/20/21  0007     History  Chief Complaint  Patient presents with   Eye Problem    Cynthia Lawrence is a 51 y.o. female with a history of anxiety, depression, asthma, seasonal allergies, depression, and PTSD who presents to the emergency department via EMS with complaints of progressively worsening eye pain and irritation over the past 6 days.  Patient states that she has developed eye pruritus, drainage, discomfort, and lid swelling which has progressed since the beginning of the week.  She has had a headache with this.  She was seen in urgent care and told she had a sinus infection and an ear infection therefore she was placed on amoxicillin and given some kind of eyedrop.  She states she has been utilizing these as prescribed without relief.  She also takes allergy medications including Zyrtec and Flonase.  Her symptoms have been progressing in her eyes, she touch base with her optometrist Dr. Precious Bard with [facility] and had sent him pictures, she states that he felt this could represent [facility] and if her discomfort gets worse to come to the emergency department.  Patient denies blurry vision, double vision, loss of vision, vomiting, or dyspnea. Patient utilizes reading glasses, she is not a contact lens wearer.   HPI     Home Medications Prior to Admission medications   Medication Sig Start Date End Date Taking? Authorizing Provider  albuterol (VENTOLIN HFA) 108 (90 Base) MCG/ACT inhaler INHALE TWO PUFFS BY MOUTH EVERY 4 TO 6 HOURS AS NEEDED FOR BRONCHOSPASM 09/22/20   [provider]  amphetamine-dextroamphetamine (ADDERALL) 5 MG tablet Take 1 tablet (5 mg total) by mouth 2 (two) times daily with breakfast and lunch. 11/04/21   Melony Overly T, PA-C  amphetamine-dextroamphetamine (ADDERALL) 5 MG tablet TAKE 1 TABLET BY MOUTH TWICE DAILY WITH BREAKFAST AND LUNCH 12/05/21    Hurst, Rosey Bath T, PA-C  amphetamine-dextroamphetamine (ADDERALL) 5 MG tablet Take 1 tablet (5 mg total) by mouth 2 (two) times daily with breakfast and lunch. 01/04/22   Cherie Ouch, PA-C  cetirizine (ZYRTEC) 10 MG tablet Take 10 mg by mouth daily.    [provider]  citalopram (CELEXA) 40 MG tablet Take 1 tablet (40 mg total) by mouth daily. 11/16/21   Melony Overly T, PA-C  fluticasone (FLONASE) 50 MCG/ACT nasal spray Place into both nostrils daily.    [provider]  lumateperone tosylate (CAPLYTA) 42 MG capsule Take 1 capsule (42 mg total) by mouth every evening. 11/16/21   Claybon Jabs, Glade Nurse, PA-C  meloxicam (MOBIC) 15 MG tablet Take 1 tablet (15 mg total) by mouth daily. Patient not taking: Reported on 11/16/2021 09/12/21   Richardean Sale, DO  Nutritional Supplements (JUICE PLUS FIBRE PO) Take by mouth daily.    [provider]  Omega-3 Fatty Acids (OMEGA 3 500 PO) Take by mouth.    [provider]  pyridoxine (B-6) 100 MG tablet Take 100 mg by mouth daily.    [provider]  Sodium Hyaluronate, Viscosup, (GELSYN-3) 16.8 MG/2ML SOSY Inject 16.8 mg into the articular space once a week. 08/30/21   Rodolph Bong, MD  valACYclovir (VALTREX) 1000 MG tablet SMARTSIG:1 Tablet(s) By Mouth Every 12 Hours 11/01/21   [provider]  vitamin B-12 (CYANOCOBALAMIN) 100 MCG tablet Take 100 mcg by mouth daily.    [provider]      Allergies  Patient has no known allergies.    Review of Systems   Review of Systems  Constitutional:  Negative for chills and fever.  HENT:  Positive for congestion and ear pain.   Eyes:  Positive for pain, discharge, redness and itching. Negative for visual disturbance.  Respiratory:  Negative for shortness of breath.   Cardiovascular:  Negative for chest pain.  Gastrointestinal:  Negative for abdominal pain and vomiting.  All other systems reviewed and are negative.  Physical Exam Updated Vital  Signs Ht 5\' 3"  (1.6 m)    Wt 79.4 kg    BMI 31.00 kg/m  Physical Exam Vitals and nursing note reviewed.  Constitutional:      General: She is not in acute distress.    Appearance: She is well-developed.  HENT:     Head: Normocephalic and atraumatic.     Right Ear: Ear canal normal. Tympanic membrane is not perforated.     Left Ear: Ear canal normal. Tympanic membrane is not perforated.     Ears:     Comments: Bilateral Tms with some fullness. No mastoid erythema/swelling/tenderness.     Nose: Congestion present.     Right Sinus: No maxillary sinus tenderness or frontal sinus tenderness.     Left Sinus: No maxillary sinus tenderness or frontal sinus tenderness.     Mouth/Throat:     Pharynx: Uvula midline. No oropharyngeal exudate or posterior oropharyngeal erythema.     Comments: Posterior oropharynx is symmetric appearing. Patient tolerating own secretions without difficulty. No trismus. No drooling. No hot potato voice. No swelling beneath the tongue, submandibular compartment is soft.  Eyes:     General: No visual field deficit.       Right eye: Discharge present.        Left eye: Discharge present.    Extraocular Movements: Extraocular movements intact.     Conjunctiva/sclera:     Right eye: Right conjunctiva is injected. Chemosis present.     Left eye: Left conjunctiva is injected. Chemosis present.     Pupils: Pupils are equal, round, and reactive to light.     Comments: No proptosis. Bilateral lid erythema and mild swelling. No extensive periorbital erythema/edema. Fluorescein stain without uptake- no evidence of abrasion at this time.   Cardiovascular:     Rate and Rhythm: Normal rate and regular rhythm.     Heart sounds: No murmur heard. Pulmonary:     Effort: Pulmonary effort is normal. No respiratory distress.     Breath sounds: Normal breath sounds. No wheezing, rhonchi or rales.  Abdominal:     General: There is no distension.     Palpations: Abdomen is soft.      Tenderness: There is no abdominal tenderness.  Musculoskeletal:     Cervical back: Normal range of motion and neck supple. No edema or rigidity.  Lymphadenopathy:     Cervical: No cervical adenopathy.  Skin:    General: Skin is warm and dry.     Findings: No rash.  Neurological:     Mental Status: She is alert.     Comments: Alert.  Clear speech.  CN II through XII grossly intact.  Sensation and strength grossly intact x4.  Psychiatric:        Behavior: Behavior normal.         ED Results / Procedures / Treatments   Labs (all labs ordered are listed, but only abnormal results are displayed) Labs Reviewed - No data to display  EKG None  Radiology No results found.  Procedures Procedures    Medications Ordered in ED Medications - No data to display  ED Course/ Medical Decision Making/ A&P                           Medical Decision Making Risk Prescription drug management.   Patient presents to the ED with complaints of eye pain bilaterally, this involves an extensive number of treatment options, and is a complaint that carries with it a high risk of complications and morbidity. Nontoxic, vitals unremarkable.    Additional history obtained:  Chart review/nursing note review .  ED Course:  On exam patient has conjunctival injection w/ chemosis, drainage, and lid irritation. No fluorescein uptake to suggest abrasion. EOMI, no proptosis, sxs bilaterally, localized to the lids, periorbital/orbital cellulitis seems less likely. I personally repeated patient's visual acuity given discussed w/ NT who relayed patient complaining of drainage effecting her vision during initial assessment. I irrigated the patient's eyes with NS which assisted with drainage, R eye, L eye, and bilateral 20/40. Patient reports she wears reading glasses, does not have them with her, she feels her vision is at baseline.   I attempted to reach patient's optometrist Dr. Renaldo Fiddler with phone number  provided by patient as well as by secretary attempting to contact the office without success. Subsequently consulted ophthalmology on call for our facility.   01:55: CONSULT: Discussed with ophthalmologist Dr. Posey Pronto- discussed presentation & exam, recommends erythromycin ophthalmic ointment in the ED and he will see patient in clinic this afternoon. Appreciate consultation.   Patient feeling improved S/p toradol. Given erythromycin ophthalmic ointment in the ED and to go home with. Will discharge home with close outpatient ophthalmology follow up.   I discussed treatment plan, need for follow-up, and return precautions with the patient and her daughter. Provided opportunity for questions, patient confirmed understanding and is in agreement with plan. Findings and plan of care discussed with supervising physician Dr. Tyrone Nine who is in agreement.   Portions of this note were generated with [employer]. Dictation errors may occur despite best attempts at proofreading.         Final Clinical Impression(s) / ED Diagnoses Final diagnoses:  Eye pain, bilateral  Conjunctivitis of both eyes, unspecified conjunctivitis type    Rx / DC Orders ED Discharge Orders     None         Amaryllis Dyke, PA-C 11/20/21 [place], [place], DO 11/20/21 [phone]

## 2021-12-05 DIAGNOSIS — M5032 Other cervical disc degeneration, mid-cervical region, unspecified level: Secondary | ICD-10-CM | POA: Diagnosis not present

## 2021-12-05 DIAGNOSIS — M9902 Segmental and somatic dysfunction of thoracic region: Secondary | ICD-10-CM | POA: Diagnosis not present

## 2021-12-05 DIAGNOSIS — M9904 Segmental and somatic dysfunction of sacral region: Secondary | ICD-10-CM | POA: Diagnosis not present

## 2021-12-05 DIAGNOSIS — R293 Abnormal posture: Secondary | ICD-10-CM | POA: Diagnosis not present

## 2021-12-05 DIAGNOSIS — M9903 Segmental and somatic dysfunction of lumbar region: Secondary | ICD-10-CM | POA: Diagnosis not present

## 2021-12-05 DIAGNOSIS — M5416 Radiculopathy, lumbar region: Secondary | ICD-10-CM | POA: Diagnosis not present

## 2021-12-05 DIAGNOSIS — M5412 Radiculopathy, cervical region: Secondary | ICD-10-CM | POA: Diagnosis not present

## 2021-12-05 DIAGNOSIS — M9905 Segmental and somatic dysfunction of pelvic region: Secondary | ICD-10-CM | POA: Diagnosis not present

## 2021-12-05 DIAGNOSIS — M9901 Segmental and somatic dysfunction of cervical region: Secondary | ICD-10-CM | POA: Diagnosis not present

## 2021-12-27 DIAGNOSIS — N3000 Acute cystitis without hematuria: Secondary | ICD-10-CM | POA: Diagnosis not present

## 2021-12-27 DIAGNOSIS — N760 Acute vaginitis: Secondary | ICD-10-CM | POA: Diagnosis not present

## 2022-01-05 DIAGNOSIS — R69 Illness, unspecified: Secondary | ICD-10-CM | POA: Diagnosis not present

## 2022-01-05 DIAGNOSIS — R899 Unspecified abnormal finding in specimens from other organs, systems and tissues: Secondary | ICD-10-CM | POA: Diagnosis not present

## 2022-01-05 DIAGNOSIS — N951 Menopausal and female climacteric states: Secondary | ICD-10-CM | POA: Diagnosis not present

## 2022-01-10 DIAGNOSIS — R69 Illness, unspecified: Secondary | ICD-10-CM | POA: Diagnosis not present

## 2022-01-31 ENCOUNTER — Telehealth: Payer: Self-pay | Admitting: Physician Assistant

## 2022-01-31 NOTE — Telephone Encounter (Signed)
Patient called regarding her prescription for Caplyta 40mg . States that insurance is messed up she is trying to figure out exactly what is going. The insurance is not covering her Caplyta right now. She would like to know if she could be prescribed something else or if she could get help with the medication as its about a $1000 right now. Pls rtc to discuss [phone] ?

## 2022-01-31 NOTE — Telephone Encounter (Signed)
PA for Caplyta was approved 10/2021. Patient said her insurance company is telling her they are going to drop her. Pulled samples for her to give her time to try to straighten things out.  ?

## 2022-02-06 DIAGNOSIS — M25511 Pain in right shoulder: Secondary | ICD-10-CM | POA: Diagnosis not present

## 2022-03-06 ENCOUNTER — Ambulatory Visit: Payer: 59 | Admitting: Family Medicine

## 2022-03-06 NOTE — Progress Notes (Deleted)
   I, Philbert Riser, LAT, ATC acting as a scribe for [name], MD.  Cynthia Lawrence is a 51 y.o. female who presents to [facility] at [facility] today for R shoulder and fingers. Pt was previously seen by Dr. Jean Rosenthal on 09/12/21 bilat costochondritis. She has seen Dr. Denyse Amass in the past, 04/26/21 for her R shoulder and her L knee on 07/18/21. Today, pt c/o R shoulder pain x/ Pt locates pain to  Pt also c/o pain in her ? finger  Dx imaging: 03/10/21 R shoulder XR  12/18/19 C-spine XR  Pertinent review of systems: ***  Relevant historical information: ***   Exam:  There were no vitals taken for this visit. General: Well Developed, well nourished, and in no acute distress.   MSK: ***    Lab and Radiology Results No results found for this or any previous visit (from the past 72 hour(s)). No results found.     Assessment and Plan: 51 y.o. female with ***   PDMP not reviewed this encounter. No orders of the defined types were placed in this encounter.  No orders of the defined types were placed in this encounter.    Discussed warning signs or symptoms. Please see discharge instructions. Patient expresses understanding.   ***

## 2022-03-07 ENCOUNTER — Encounter: Payer: Self-pay | Admitting: Family Medicine

## 2022-03-07 ENCOUNTER — Ambulatory Visit: Payer: Self-pay

## 2022-03-07 ENCOUNTER — Ambulatory Visit (INDEPENDENT_AMBULATORY_CARE_PROVIDER_SITE_OTHER): Payer: Self-pay | Admitting: Family Medicine

## 2022-03-07 VITALS — BP 120/72 | HR 85 | Ht 63.0 in | Wt 175.8 lb

## 2022-03-07 DIAGNOSIS — M65331 Trigger finger, right middle finger: Secondary | ICD-10-CM

## 2022-03-07 DIAGNOSIS — G5601 Carpal tunnel syndrome, right upper limb: Secondary | ICD-10-CM

## 2022-03-07 DIAGNOSIS — M25511 Pain in right shoulder: Secondary | ICD-10-CM

## 2022-03-07 DIAGNOSIS — G8929 Other chronic pain: Secondary | ICD-10-CM

## 2022-03-07 DIAGNOSIS — R202 Paresthesia of skin: Secondary | ICD-10-CM

## 2022-03-07 DIAGNOSIS — R200 Anesthesia of skin: Secondary | ICD-10-CM

## 2022-03-07 NOTE — Patient Instructions (Addendum)
Good to see you today.  Double band-aid splint  Wrist splint at night  Work on your shoulder exercises at home  Follow-up: 2 months if not better

## 2022-03-07 NOTE — Progress Notes (Signed)
I, Cynthia Lawrence, LAT, ATC, am serving as scribe for Dr. [name].  Cynthia Lawrence is a 51 y.o. female who presents to [facility] at [facility] today for R shoulder and fingers. Pt was previously seen by Dr. Jean Rosenthal on 09/12/21 bilat costochondritis. She has seen Dr. Denyse Amass in the past, 04/26/21 for her R shoulder and her L knee on 07/18/21. Today, pt c/o R shoulder pain x . Pt locates pain to .  She has been going to a chiropractor and states that she had an XR of her neck indicating loss of cervical lordosis and spurs.  She has pain in both arms but more isolated in her R shoulder.  She locates her R shoulder pain to her R lateral shoulder.  Pt also c/o numbness in her R middle and L ring finger w/ associated numbness in her neck.  She also notes some triggering of her right long finger.  This has been ongoing for few weeks.  Dx imaging: 03/10/21 R shoulder XR  12/18/19 C-spine XR  Pertinent review of systems: No fevers or chills  Relevant historical information: PTSD.  Bipolar disorder.   Exam:  BP 120/72 (BP Location: Left Arm, Patient Position: Sitting, Cuff Size: Normal)   Pulse 85   Ht 5\' 3"  (1.6 m)   Wt 175 lb 12.8 oz (79.7 kg)   SpO2 97%   BMI 31.14 kg/m  General: Well Developed, well nourished, and in no acute distress.   MSK: C-spine: Normal. Nontender midline. Normal cervical motion. Negative Spurling's test. Upper semistrength is intact.  Right shoulder: Normal. Nontender normal motion intact strength mildly positive Hawkins and Neer's test.  Right wrist normal. Positive Tinel's at carpal tunnel.  Intact strength. Triggering present with flexion of right third PIP.  Tender palpation at third MCP palmar aspect.    Lab and Radiology Results  EXAM: CERVICAL SPINE - COMPLETE 4+ VIEW   COMPARISON:  None.   FINDINGS: No fracture, bone lesion or spondylolisthesis. Generalized straightening of the normal cervical lordosis.   Mild loss of  disc height at C4-C5, C5-C6 and C6-C7 with endplate spurring. Remaining cervical discs are well preserved. Neural foramina are widely patent.   Soft tissues are unremarkable.   IMPRESSION: 1. No fracture, bone lesion or spondylolisthesis. 2. Mild disc degenerative changes from C4-C5 through C6-C7.     Electronically Signed   By: M.D.   On: 12/18/2019 15:08   I, 12/20/2019, personally (independently) visualized and performed the interpretation of the images attached in this note.      Assessment and Plan: 51 y.o. female with right shoulder pain thought to be due to shoulder impingement.  Doing quite well with only little bit of residual pain.  She last had a subacromial injection about a year ago.  Plan to continue home exercise program previously taught and if pain is returning can repeat injection if needed.  She does have some paresthesias distally in the third and fourth digits however with positive Tinel's the carpal tunnel her symptoms are most likely to be related to carpal tunnel and cervical radiculopathy.  Plan for carpal tunnel wrist brace.  Additionally she notes trigger finger right third digit.  Plan for double BandIT splint and Voltaren gel.  If not improving within 1 month return to clinic and we will proceed with trigger finger injection very likely.     Discussed warning signs or symptoms. Please see discharge instructions. Patient expresses understanding.   The above  documentation has been reviewed and is accurate and complete Lynne Leader, M.D.

## 2022-05-17 ENCOUNTER — Ambulatory Visit: Payer: Self-pay | Admitting: Physician Assistant

## 2022-06-20 ENCOUNTER — Ambulatory Visit (INDEPENDENT_AMBULATORY_CARE_PROVIDER_SITE_OTHER): Payer: Self-pay | Admitting: Physician Assistant

## 2022-06-20 ENCOUNTER — Encounter: Payer: Self-pay | Admitting: Physician Assistant

## 2022-06-20 DIAGNOSIS — F900 Attention-deficit hyperactivity disorder, predominantly inattentive type: Secondary | ICD-10-CM

## 2022-06-20 DIAGNOSIS — F99 Mental disorder, not otherwise specified: Secondary | ICD-10-CM

## 2022-06-20 DIAGNOSIS — F5105 Insomnia due to other mental disorder: Secondary | ICD-10-CM

## 2022-06-20 DIAGNOSIS — F319 Bipolar disorder, unspecified: Secondary | ICD-10-CM

## 2022-06-20 MED ORDER — AMPHETAMINE-DEXTROAMPHETAMINE 5 MG PO TABS: ORAL_TABLET | ORAL | 0 refills | Status: DC

## 2022-06-20 MED ORDER — AMPHETAMINE-DEXTROAMPHETAMINE 5 MG PO TABS
5.0000 mg | ORAL_TABLET | Freq: Two times a day (BID) | ORAL | 0 refills | Status: DC
Start: 2022-06-20 — End: 2023-01-03

## 2022-06-20 MED ORDER — AMPHETAMINE-DEXTROAMPHETAMINE 5 MG PO TABS
5.0000 mg | ORAL_TABLET | Freq: Two times a day (BID) | ORAL | 0 refills | Status: DC
Start: 2022-08-18 — End: 2023-01-03
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
## 2022-09-12 ENCOUNTER — Telehealth: Payer: Self-pay | Admitting: Physician Assistant

## 2022-09-12 NOTE — Telephone Encounter (Signed)
Called patient and she wants to start back on Celexa and Caplyta. She described panic episodes and "manic episodes." I asked her to explain what she meant by manic and she said she is under a lot pressure and doesn't feel she can make the right decisions currently. I told her that we could provide samples of Caplyta, depending on if you were in agreement of her restarting these medications. She stopped the medications because she had lost her insurance. At visit in September she was off the medications, but not sure for how long.   [facility].

## 2022-09-12 NOTE — Telephone Encounter (Signed)
Patient called in stating that her insurance lapsed last year and she couldn't get her Caplyta. She will be getting new insurance eff 09/25/22 but doesn't think it cover meds for about 60 days. She is in need of her medication and would like to know if there is any help that can be provided such as coupons or anything TH can do. Pls rtc [phone] Appt 3/26 Is willing to come in for appt if needed

## 2022-09-12 NOTE — Telephone Encounter (Signed)
We can send in the Celexa, it should be inexpensive on [facility]. Start out w/ 10 mg daily. #30 RF 1. On the Caplyta, we can give a 2 week supply but that's all. In the meantime, she can go on their website and apply for pt assistance, to get her to the time when hopefully, her ins will cover.

## 2022-09-13 MED ORDER — CITALOPRAM HYDROBROMIDE 10 MG PO TABS: 10.0000 mg | ORAL_TABLET | Freq: Every day | ORAL | 0 refills | Status: DC

## 2022-09-13 NOTE — Telephone Encounter (Signed)
Patient notified. Pulled samples of Caplyta and Rx for citalopram sent.

## 2022-09-22 ENCOUNTER — Other Ambulatory Visit (HOSPITAL_BASED_OUTPATIENT_CLINIC_OR_DEPARTMENT_OTHER): Payer: Self-pay

## 2022-10-10 ENCOUNTER — Other Ambulatory Visit: Payer: Self-pay | Admitting: Physician Assistant

## 2022-11-20 ENCOUNTER — Telehealth: Payer: Self-pay | Admitting: Physician Assistant

## 2022-11-20 ENCOUNTER — Other Ambulatory Visit: Payer: Self-pay

## 2022-11-20 MED ORDER — AMPHETAMINE-DEXTROAMPHETAMINE 5 MG PO TABS: ORAL_TABLET | ORAL | 0 refills | Status: DC

## 2022-11-20 NOTE — Telephone Encounter (Signed)
Patient lvm with refill request for Adderall '5mg'$  sent to [place], [place] Appt 3/26 Ph: [phone]

## 2022-11-20 NOTE — Telephone Encounter (Signed)
Pended.

## 2022-11-22 DIAGNOSIS — Z1231 Encounter for screening mammogram for malignant neoplasm of breast: Secondary | ICD-10-CM | POA: Diagnosis not present

## 2022-11-27 ENCOUNTER — Other Ambulatory Visit: Payer: Self-pay | Admitting: Obstetrics and Gynecology

## 2022-11-27 DIAGNOSIS — R928 Other abnormal and inconclusive findings on diagnostic imaging of breast: Secondary | ICD-10-CM

## 2022-12-15 DIAGNOSIS — B379 Candidiasis, unspecified: Secondary | ICD-10-CM | POA: Diagnosis not present

## 2022-12-15 DIAGNOSIS — R350 Frequency of micturition: Secondary | ICD-10-CM | POA: Diagnosis not present

## 2022-12-15 DIAGNOSIS — N3000 Acute cystitis without hematuria: Secondary | ICD-10-CM | POA: Diagnosis not present

## 2022-12-15 DIAGNOSIS — T3695XA Adverse effect of unspecified systemic antibiotic, initial encounter: Secondary | ICD-10-CM | POA: Diagnosis not present

## 2022-12-15 DIAGNOSIS — Z6831 Body mass index (BMI) 31.0-31.9, adult: Secondary | ICD-10-CM | POA: Diagnosis not present

## 2022-12-19 ENCOUNTER — Ambulatory Visit (INDEPENDENT_AMBULATORY_CARE_PROVIDER_SITE_OTHER): Payer: Self-pay | Admitting: Physician Assistant

## 2023-01-03 ENCOUNTER — Ambulatory Visit (INDEPENDENT_AMBULATORY_CARE_PROVIDER_SITE_OTHER): Payer: 59 | Admitting: Physician Assistant

## 2023-01-03 ENCOUNTER — Encounter: Payer: Self-pay | Admitting: Physician Assistant

## 2023-01-03 DIAGNOSIS — F4310 Post-traumatic stress disorder, unspecified: Secondary | ICD-10-CM

## 2023-01-03 DIAGNOSIS — F4321 Adjustment disorder with depressed mood: Secondary | ICD-10-CM | POA: Diagnosis not present

## 2023-01-03 DIAGNOSIS — F900 Attention-deficit hyperactivity disorder, predominantly inattentive type: Secondary | ICD-10-CM | POA: Diagnosis not present

## 2023-01-03 MED ORDER — AMPHETAMINE-DEXTROAMPHETAMINE 10 MG PO TABS: 10.0000 mg | ORAL_TABLET | Freq: Two times a day (BID) | ORAL | 0 refills | Status: DC
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
## 2023-01-29 ENCOUNTER — Telehealth: Payer: Self-pay | Admitting: Physician Assistant

## 2023-01-29 NOTE — Telephone Encounter (Signed)
Patient canceled appt for 5/8.  Reports doing well and would like to push appt out to 3-6 mo.   Last visit 4/10:  Increase Adderall to 10 mg, 1 p.o. at breakfast and 1 at lunch.  Return in 4 weeks.

## 2023-01-29 NOTE — Telephone Encounter (Signed)
Patient informed. 

## 2023-01-29 NOTE — Telephone Encounter (Signed)
Pt called and said that she is doing well. She would like to push out her next appt to 3 or 6 months out. Is that ok with teresa. She was suppose to come in on 5/8

## 2023-01-29 NOTE — Telephone Encounter (Signed)
Ok to r/s for August.

## 2023-01-31 ENCOUNTER — Ambulatory Visit: Payer: 59 | Admitting: Physician Assistant

## 2023-03-05 ENCOUNTER — Telehealth: Payer: Self-pay | Admitting: Physician Assistant

## 2023-03-05 ENCOUNTER — Other Ambulatory Visit: Payer: Self-pay | Admitting: Physician Assistant

## 2023-03-05 MED ORDER — AMPHETAMINE-DEXTROAMPHETAMINE 10 MG PO TABS: 10.0000 mg | ORAL_TABLET | Freq: Two times a day (BID) | ORAL | 0 refills | Status: DC

## 2023-03-05 NOTE — Telephone Encounter (Signed)
Lvm for pt to call and schedule in august

## 2023-03-05 NOTE — Telephone Encounter (Signed)
Pt lvm that she needs her adderall 10 mg [facility] [address]

## 2023-03-05 NOTE — Telephone Encounter (Signed)
Please let her know I sent the Rx. She needs to schedule and appt for sometime in August for f/u. Thanks.

## 2023-03-28 ENCOUNTER — Other Ambulatory Visit: Payer: Self-pay

## 2023-03-28 ENCOUNTER — Ambulatory Visit: Payer: 59 | Admitting: Family Medicine

## 2023-03-28 ENCOUNTER — Encounter: Payer: Self-pay | Admitting: Family Medicine

## 2023-03-28 VITALS — BP 120/80 | HR 109 | Ht 63.0 in | Wt 182.0 lb

## 2023-03-28 DIAGNOSIS — S9031XA Contusion of right foot, initial encounter: Secondary | ICD-10-CM | POA: Diagnosis not present

## 2023-03-28 DIAGNOSIS — M79672 Pain in left foot: Secondary | ICD-10-CM

## 2023-03-28 MED ORDER — PREDNISONE 50 MG PO TABS: ORAL_TABLET | ORAL | 0 refills | Status: DC

## 2023-03-28 NOTE — Patient Instructions (Addendum)
[employer]  Exercises Prednisone 50mg  for 5 days Recovery sandals Tart Cherry Extract 1200mg  at night See me again in 2 months

## 2023-03-28 NOTE — Assessment & Plan Note (Signed)
Patient has a heel contusion on the right side.  Seems to have fat pad irritation.  I do not see any cortical defect of the calcaneal area.  We discussed with patient about different treatment options, discussed which activities to do and which ones to avoid.  Increase activity slowly.  Follow-up with me again after prednisone, proper shoes, follow-up again in 6 to 8 weeks.

## 2023-03-28 NOTE — Progress Notes (Signed)
[facility] [address] [place] [address] Phone: [phone] Subjective:   [name], am serving as a scribe for Dr. Antoine Primas.  I'm seeing this patient by the request  of:  Rivard, Dois Davenport, MD  CC: Left heel pain follow-up   [id number]  Cynthia Lawrence is a 52 y.o. female coming in with complaint of L heel pain. Patient feels like needle is in her heel for past 2 months. Supportive shoes help her pain. Bearing weight increases her pain. Pain directly on bottom of her heel.        Past Medical History:  Diagnosis Date   Allergy    Anxiety    Asthma    Chronic tonsillitis    Depression    Seasonal allergies    Past Surgical History:  Procedure Laterality Date   ENDOMETRIAL ABLATION     EYE SURGERY     lasik   TONSILLECTOMY Bilateral 08/15/2016   Procedure: TONSILLECTOMY;  Surgeon: Drema Halon, MD;  Location: [facility];  Service: ENT;  Laterality: Bilateral;   WISDOM TOOTH EXTRACTION     Social History   Socioeconomic History   Marital status: Married    Spouse name: Not on file   Number of children: Not on file   Years of education: Not on file   Highest education level: Not on file  Occupational History   Not on file  Tobacco Use   Smoking status: Never   Smokeless tobacco: Never  Vaping Use   Vaping Use: Never used  Substance and Sexual Activity   Alcohol use: Not Currently   Drug use: Yes    Types: Marijuana    Comment: occasionally   Sexual activity: Yes    Birth control/protection: Surgical    Comment: ablation  Other Topics Concern   Not on file  Social History Narrative   Left handed   Lives family in one story home,.   Social Determinants of Health   Financial Resource Strain: Not on file  Food Insecurity: Not on file  Transportation Needs: Not on file  Physical Activity: Not on file  Stress: Not on file  Social Connections: Not on file   No Known  Allergies Family History  Problem Relation Age of Onset   Diabetes Mother    Stroke Mother    Diabetes Father    Healthy Sister    Healthy Brother    Healthy Daughter    Healthy Son    Healthy Sister    Healthy Daughter    Healthy Daughter     Current Outpatient Medications (Endocrine & Metabolic):    predniSONE (DELTASONE) 50 MG tablet, Take one tablet daily for the next 5 days.   Current Outpatient Medications (Respiratory):    albuterol (VENTOLIN HFA) 108 (90 Base) MCG/ACT inhaler,    cetirizine (ZYRTEC) 10 MG tablet, Take 10 mg by mouth daily.   fluticasone (FLONASE) 50 MCG/ACT nasal spray, Place into both nostrils daily.   Current Outpatient Medications (Hematological):    vitamin B-12 (CYANOCOBALAMIN) 100 MCG tablet, Take 100 mcg by mouth daily.  Current Outpatient Medications (Other):    amphetamine-dextroamphetamine (ADDERALL) 10 MG tablet, Take 1 tablet (10 mg total) by mouth 2 (two) times daily with breakfast and lunch.   Cannabinoids (THC FREE PO), Take by mouth.   doxylamine, Sleep, (UNISOM) 25 MG tablet, Take 25 mg by mouth at bedtime as needed.   Nutritional Supplements (JUICE PLUS FIBRE PO), Take by  mouth daily.   Omega-3 Fatty Acids (OMEGA 3 500 PO), Take by mouth.   pyridoxine (B-6) 100 MG tablet, Take 100 mg by mouth daily.   Sodium Hyaluronate, Viscosup, (GELSYN-3) 16.8 MG/2ML SOSY, Inject 16.8 mg into the articular space once a week.   valACYclovir (VALTREX) 1000 MG tablet,    Reviewed prior external information including notes and imaging from  primary care provider As well as notes that were available from care everywhere and other healthcare systems.  Past medical history, social, surgical and family history all reviewed in electronic medical record.  No pertanent information unless stated regarding to the chief complaint.   Review of Systems:  No headache, visual changes, nausea, vomiting, diarrhea, constipation, dizziness, abdominal pain, skin  rash, fevers, chills, night sweats, weight loss, swollen lymph nodes, joint swelling, chest pain, shortness of breath, mood changes. POSITIVE muscle aches, body aches   Objective  Blood pressure 120/80, pulse (!) 109, height 5\' 3"  (1.6 m), weight 182 lb (82.6 kg), SpO2 97 %.   General: No apparent distress alert and oriented x3 mood and affect normal, dressed appropriately.  HEENT: Pupils equal, extraocular movements intact  Respiratory: Patient's speak in full sentences and does not appear short of breath  Cardiovascular: No lower extremity edema, non tender, no erythema  Patient's ankle relatively good range of motion.  Severely tender to palpation on the plantar aspect of the foot.   Limited muscular skeletal ultrasound was performed and interpreted by Antoine Primas, M  Limited ultrasound shows significant hypoechoic changes noted.  The fat pad of the heel.  No significant thickening of the heel noted at this time. Impression: Patient with contusion versus fat pad irritation   Impression and Recommendations:     The above documentation has been reviewed and is accurate and complete Judi Saa, DO

## 2023-04-26 ENCOUNTER — Ambulatory Visit: Payer: 59 | Admitting: Physician Assistant

## 2023-05-24 NOTE — Progress Notes (Deleted)
Cynthia Lawrence [address] [place] [address] Phone: [phone] Subjective:    I'm seeing this patient by the request  of:  Rivard, Dois Davenport, MD  CC:   [id number]  03/28/2023 Patient has a heel contusion on the right side. Seems to have fat pad irritation. I do not see any cortical defect of the calcaneal area. We discussed with patient about different treatment options, discussed which activities to do and which ones to avoid. Increase activity slowly. Follow-up with me again after prednisone, proper shoes, follow-up again in 6 to 8 weeks.   Updated 06/01/2023 Cynthia Lawrence is a 52 y.o. female coming in with complaint of R heel       Past Medical History:  Diagnosis Date   Allergy    Anxiety    Asthma    Chronic tonsillitis    Depression    Seasonal allergies    Past Surgical History:  Procedure Laterality Date   ENDOMETRIAL ABLATION     EYE SURGERY     lasik   TONSILLECTOMY Bilateral 08/15/2016   Procedure: TONSILLECTOMY;  Surgeon: Drema Halon, MD;  Location: [facility];  Service: ENT;  Laterality: Bilateral;   WISDOM TOOTH EXTRACTION     Social History   Socioeconomic History   Marital status: Married    Spouse name: Not on file   Number of children: Not on file   Years of education: Not on file   Highest education level: Not on file  Occupational History   Not on file  Tobacco Use   Smoking status: Never   Smokeless tobacco: Never  Vaping Use   Vaping status: Never Used  Substance and Sexual Activity   Alcohol use: Not Currently   Drug use: Yes    Types: Marijuana    Comment: occasionally   Sexual activity: Yes    Birth control/protection: Surgical    Comment: ablation  Other Topics Concern   Not on file  Social History Narrative   Left handed   Lives family in one story home,.   Social Determinants of Health   Financial Resource Strain: Not on file  Food Insecurity: Not on file   Transportation Needs: Not on file  Physical Activity: Not on file  Stress: Not on file  Social Connections: Not on file   No Known Allergies Family History  Problem Relation Age of Onset   Diabetes Mother    Stroke Mother    Diabetes Father    Healthy Sister    Healthy Brother    Healthy Daughter    Healthy Son    Healthy Sister    Healthy Daughter    Healthy Daughter     Current Outpatient Medications (Endocrine & Metabolic):    predniSONE (DELTASONE) 50 MG tablet, Take one tablet daily for the next 5 days.   Current Outpatient Medications (Respiratory):    albuterol (VENTOLIN HFA) 108 (90 Base) MCG/ACT inhaler,    cetirizine (ZYRTEC) 10 MG tablet, Take 10 mg by mouth daily.   fluticasone (FLONASE) 50 MCG/ACT nasal spray, Place into both nostrils daily.   Current Outpatient Medications (Hematological):    vitamin B-12 (CYANOCOBALAMIN) 100 MCG tablet, Take 100 mcg by mouth daily.  Current Outpatient Medications (Other):    amphetamine-dextroamphetamine (ADDERALL) 10 MG tablet, Take 1 tablet (10 mg total) by mouth 2 (two) times daily with breakfast and lunch.   Cannabinoids (THC FREE PO), Take by mouth.   doxylamine, Sleep, (UNISOM) 25 MG  tablet, Take 25 mg by mouth at bedtime as needed.   Nutritional Supplements (JUICE PLUS FIBRE PO), Take by mouth daily.   Omega-3 Fatty Acids (OMEGA 3 500 PO), Take by mouth.   pyridoxine (B-6) 100 MG tablet, Take 100 mg by mouth daily.   Sodium Hyaluronate, Viscosup, (GELSYN-3) 16.8 MG/2ML SOSY, Inject 16.8 mg into the articular space once a week.   valACYclovir (VALTREX) 1000 MG tablet,    Reviewed prior external information including notes and imaging from  primary care provider As well as notes that were available from care everywhere and other healthcare systems.  Past medical history, social, surgical and family history all reviewed in electronic medical record.  No pertanent information unless stated regarding to the chief  complaint.   Review of Systems:  No headache, visual changes, nausea, vomiting, diarrhea, constipation, dizziness, abdominal pain, skin rash, fevers, chills, night sweats, weight loss, swollen lymph nodes, body aches, joint swelling, chest pain, shortness of breath, mood changes. POSITIVE muscle aches  Objective  There were no vitals taken for this visit.   General: No apparent distress alert and oriented x3 mood and affect normal, dressed appropriately.  HEENT: Pupils equal, extraocular movements intact  Respiratory: Patient's speak in full sentences and does not appear short of breath  Cardiovascular: No lower extremity edema, non tender, no erythema      Impression and Recommendations:

## 2023-05-29 ENCOUNTER — Ambulatory Visit (INDEPENDENT_AMBULATORY_CARE_PROVIDER_SITE_OTHER): Payer: 59 | Admitting: Physician Assistant

## 2023-05-29 ENCOUNTER — Encounter: Payer: Self-pay | Admitting: Physician Assistant

## 2023-05-29 ENCOUNTER — Other Ambulatory Visit: Payer: Self-pay | Admitting: Physician Assistant

## 2023-05-29 DIAGNOSIS — F900 Attention-deficit hyperactivity disorder, predominantly inattentive type: Secondary | ICD-10-CM

## 2023-05-29 DIAGNOSIS — Z79899 Other long term (current) drug therapy: Secondary | ICD-10-CM

## 2023-05-29 DIAGNOSIS — F319 Bipolar disorder, unspecified: Secondary | ICD-10-CM | POA: Diagnosis not present

## 2023-05-29 DIAGNOSIS — F4310 Post-traumatic stress disorder, unspecified: Secondary | ICD-10-CM | POA: Diagnosis not present

## 2023-05-29 MED ORDER — AMPHETAMINE-DEXTROAMPHETAMINE 10 MG PO TABS: 10.0000 mg | ORAL_TABLET | Freq: Two times a day (BID) | ORAL | 0 refills | Status: DC

## 2023-05-29 MED ORDER — LYBALVI 10-10 MG PO TABS: 1.0000 | ORAL_TABLET | Freq: Every day | ORAL | 1 refills | Status: DC
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
## 2023-05-31 NOTE — Telephone Encounter (Signed)
PA for Lybalvi, has not tried any of the recommended medications.

## 2023-06-01 ENCOUNTER — Ambulatory Visit: Payer: 59 | Admitting: Family Medicine

## 2023-06-06 ENCOUNTER — Telehealth: Payer: Self-pay | Admitting: Physician Assistant

## 2023-06-06 NOTE — Telephone Encounter (Signed)
Patient lvm at 3:03 regarding Lybalvi medication. States that the side effects "are not good." She has been experiencing dry mouth constipation really upset stomach and a very jittery. She would like to discuss other options of a dosage change. Ph: [phone]

## 2023-06-07 NOTE — Telephone Encounter (Signed)
Patient reported taking Lybalvi 5-10 for 7 days.  Was very nauseated, jittery, and constipated. She did not take yesterday and said she feels much better today, though still reported constipation. She said she was taking with food. Encouraged her to push fluids and increase fiber for the constipation. She said she just couldn't take it.   Pharmacy - [facility].

## 2023-06-07 NOTE — Telephone Encounter (Signed)
Start Lybalvi 5-10 mg #14 samples were given, 1 p.o. nightly. Then prescription sent for 10-10 mg, 1 p.o. nightly.

## 2023-06-07 NOTE — Telephone Encounter (Signed)
Recommend Vraylar 1.5 mg.  It is in the same class of drugs but has less risk of weight gain, movement disorders, metabolic problems then some of the other medications in that group.  There is a possibility she could have dizziness, headache, nausea, fatigue, when first starting it but we will not know until we try it.  Have her pick up samples, enough for 2 weeks.  Have her call in 1 week to let us know how it is working and whether she is having side effects or not, then I can send in the prescription.  Thanks.

## 2023-06-07 NOTE — Telephone Encounter (Addendum)
LVM to [facility]. Pulled samples and a co-pay card.

## 2023-06-07 NOTE — Telephone Encounter (Signed)
Patient notified

## 2023-06-13 IMAGING — DX DG SHOULDER 2+V*R*
3 series · 3 of 3 positions shown · non-contrast
Comparison: None.

CLINICAL DATA: Shoulder pain

EXAM:
RIGHT SHOULDER - 2+ VIEW

[shoulder ap (1 of 2)]
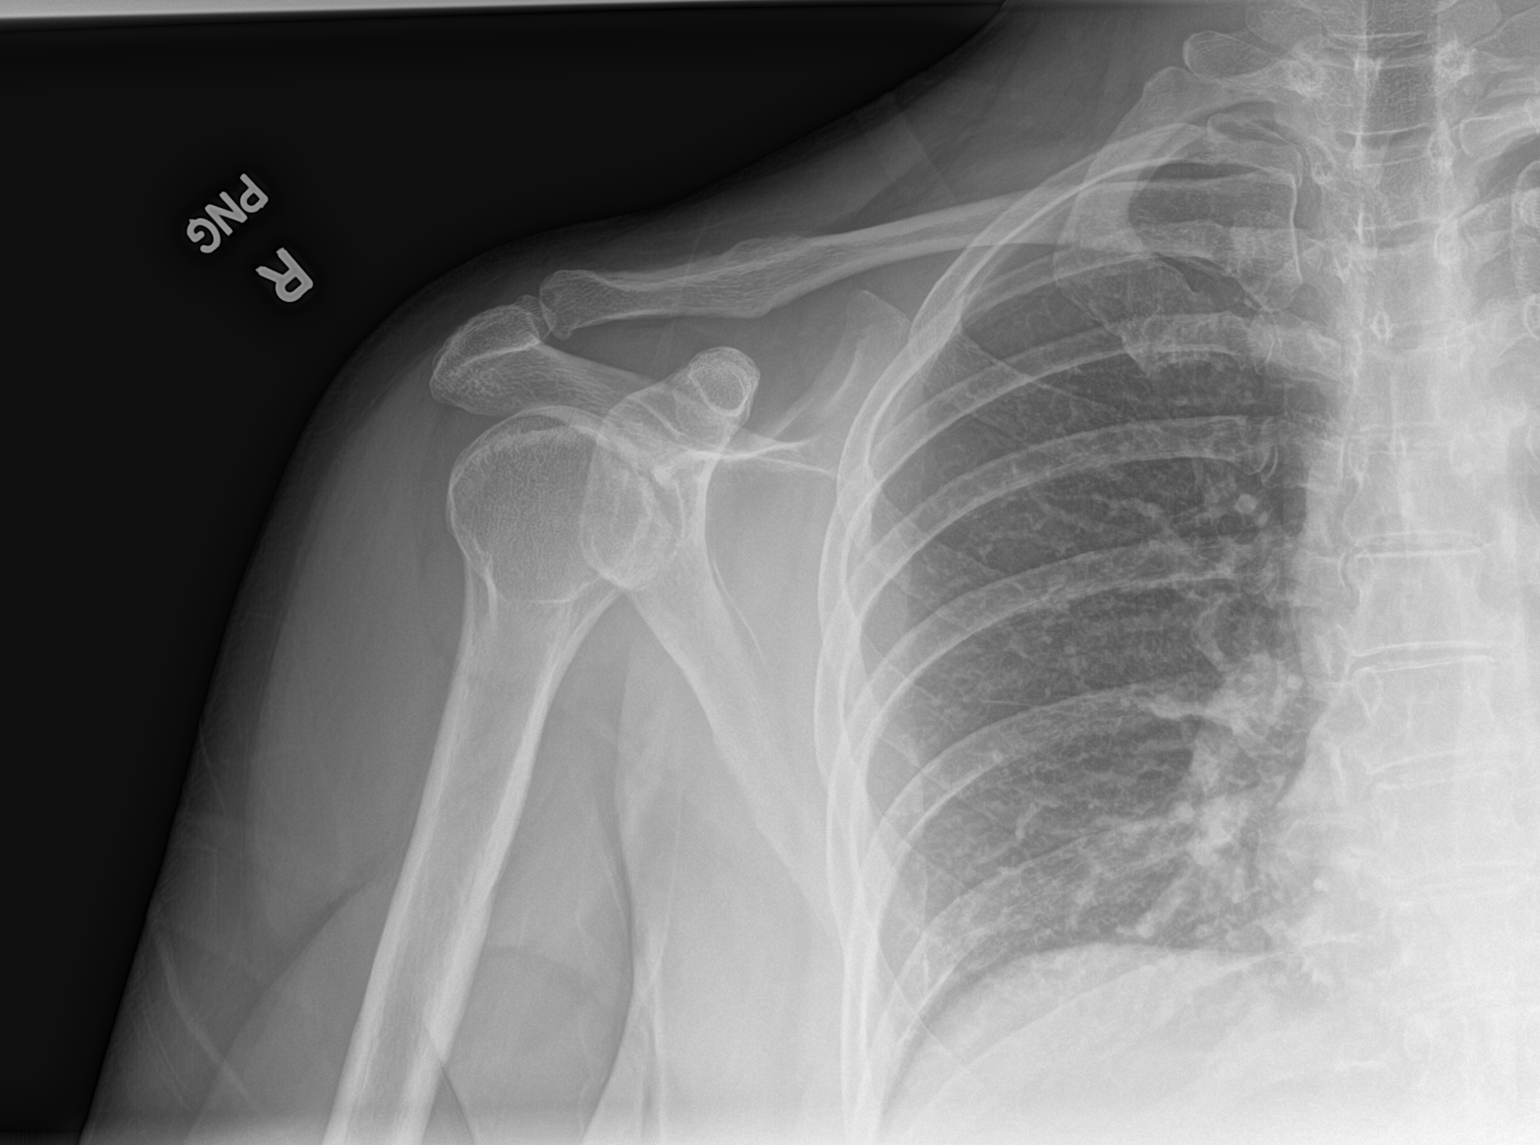

[shoulder ap (2 of 2)]
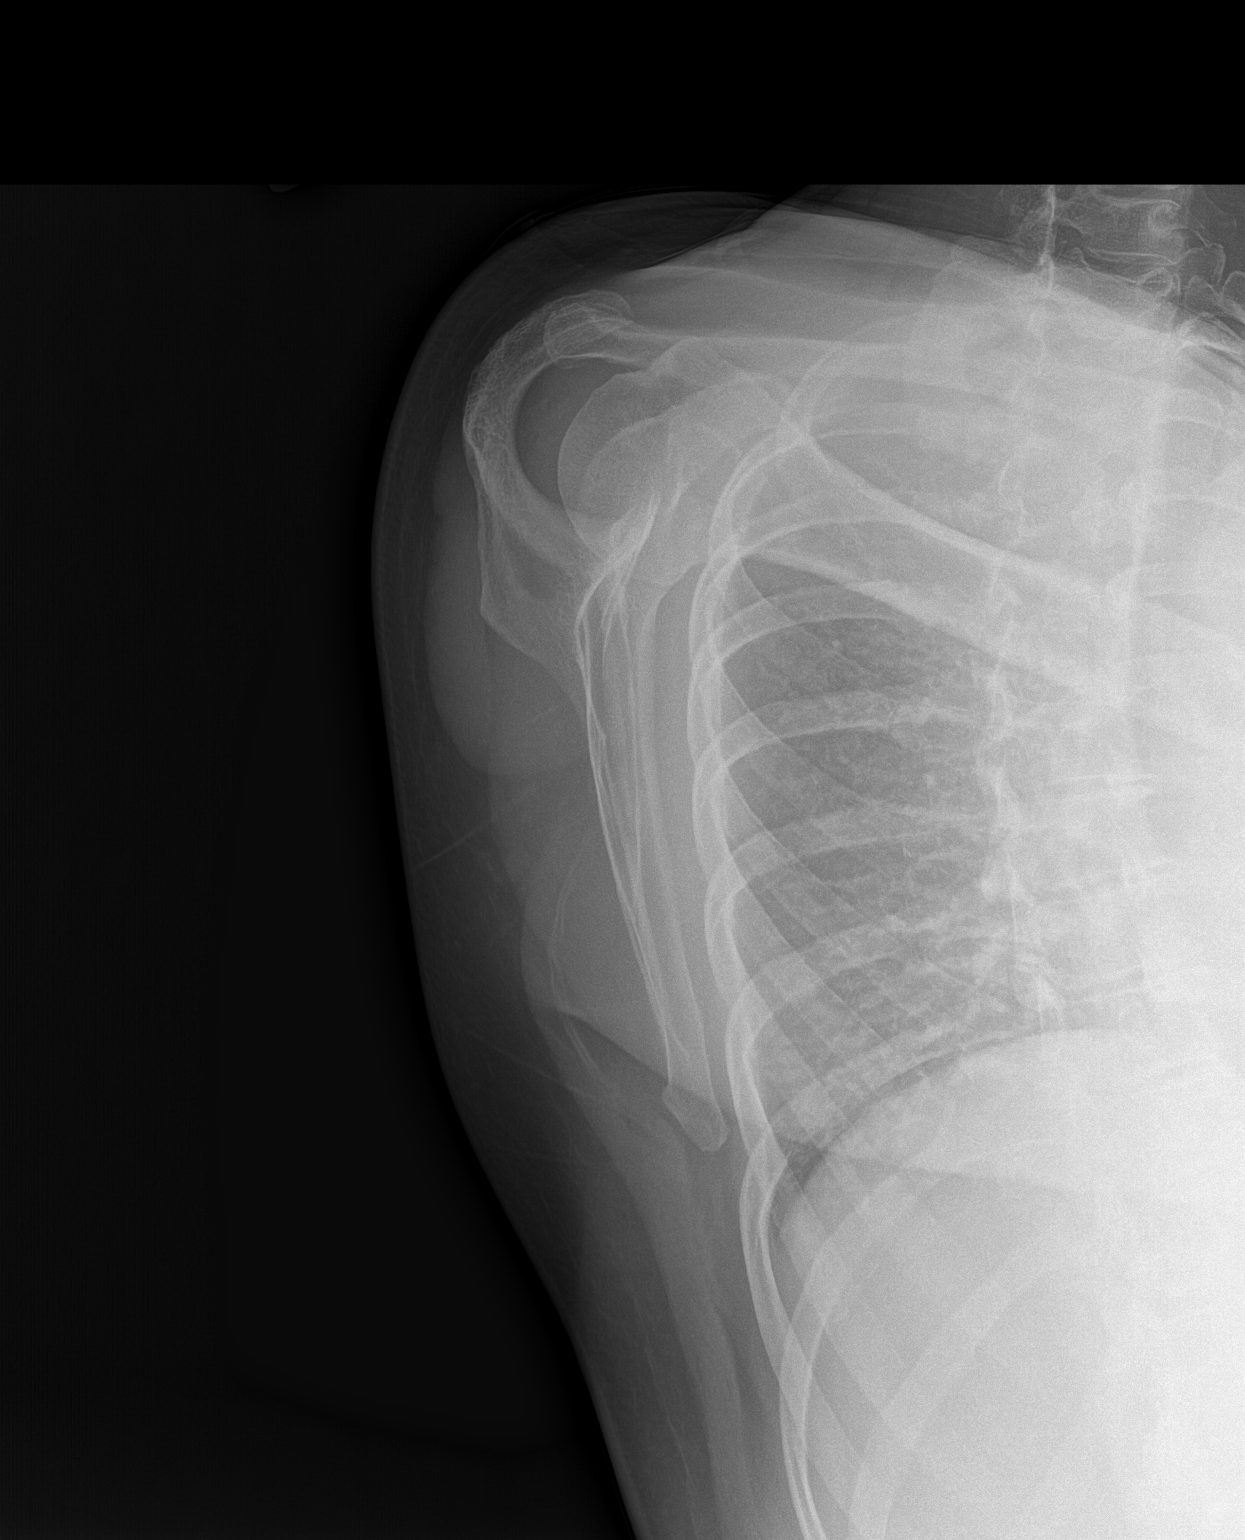

[shoulder axial]
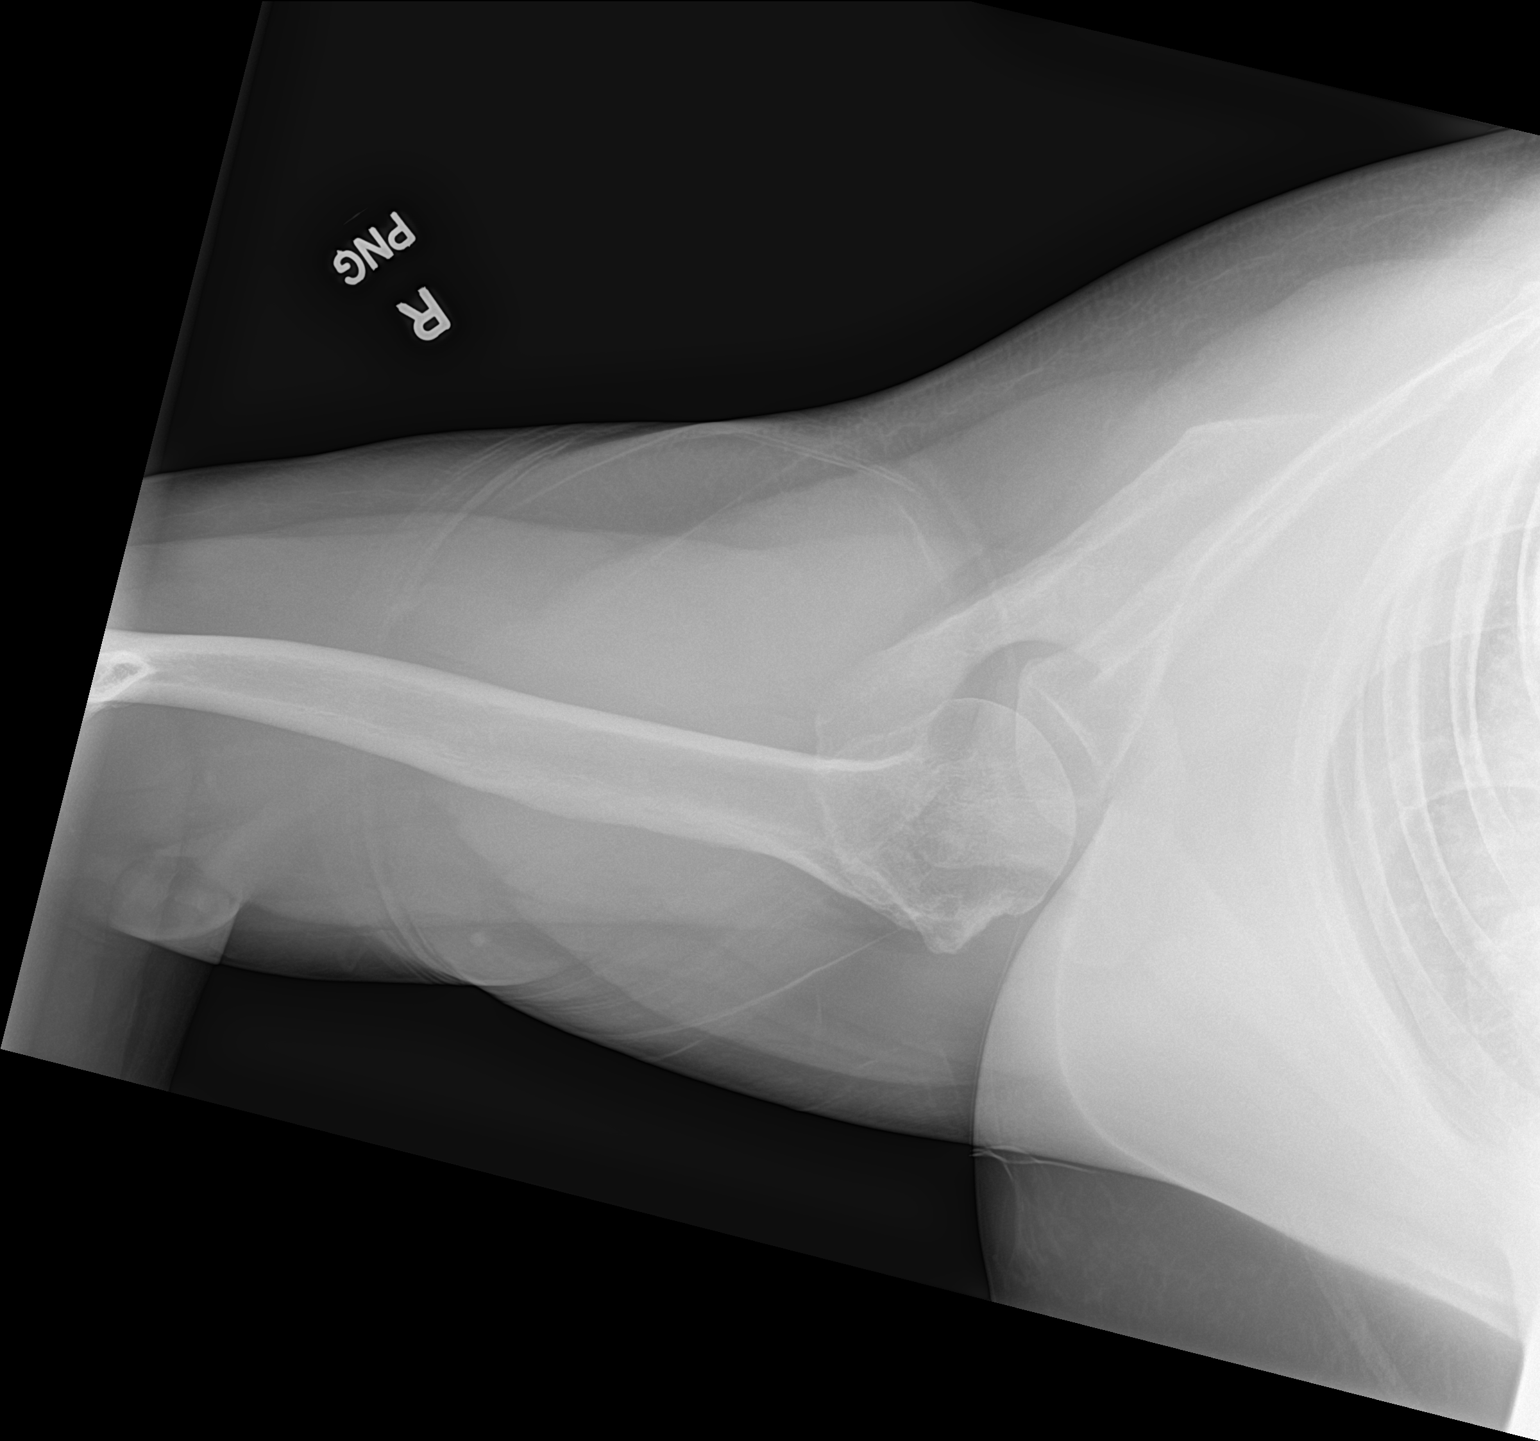

[3 of 3 positions shown; findings below may reference images not displayed]

FINDINGS: There is no evidence of fracture or dislocation. There is no
evidence of arthropathy or other focal bone abnormality. Soft
tissues are unremarkable.
IMPRESSION: Negative.

## 2023-06-13 IMAGING — DX DG LUMBAR SPINE 2-3V
3 series · 3 of 3 positions shown · non-contrast
Comparison: None

CLINICAL DATA: Pelvic and BILATERAL SI joint pain, evaluate lumbar
spine

EXAM:
LUMBAR SPINE - 2-3 VIEW

[l-spine ap]
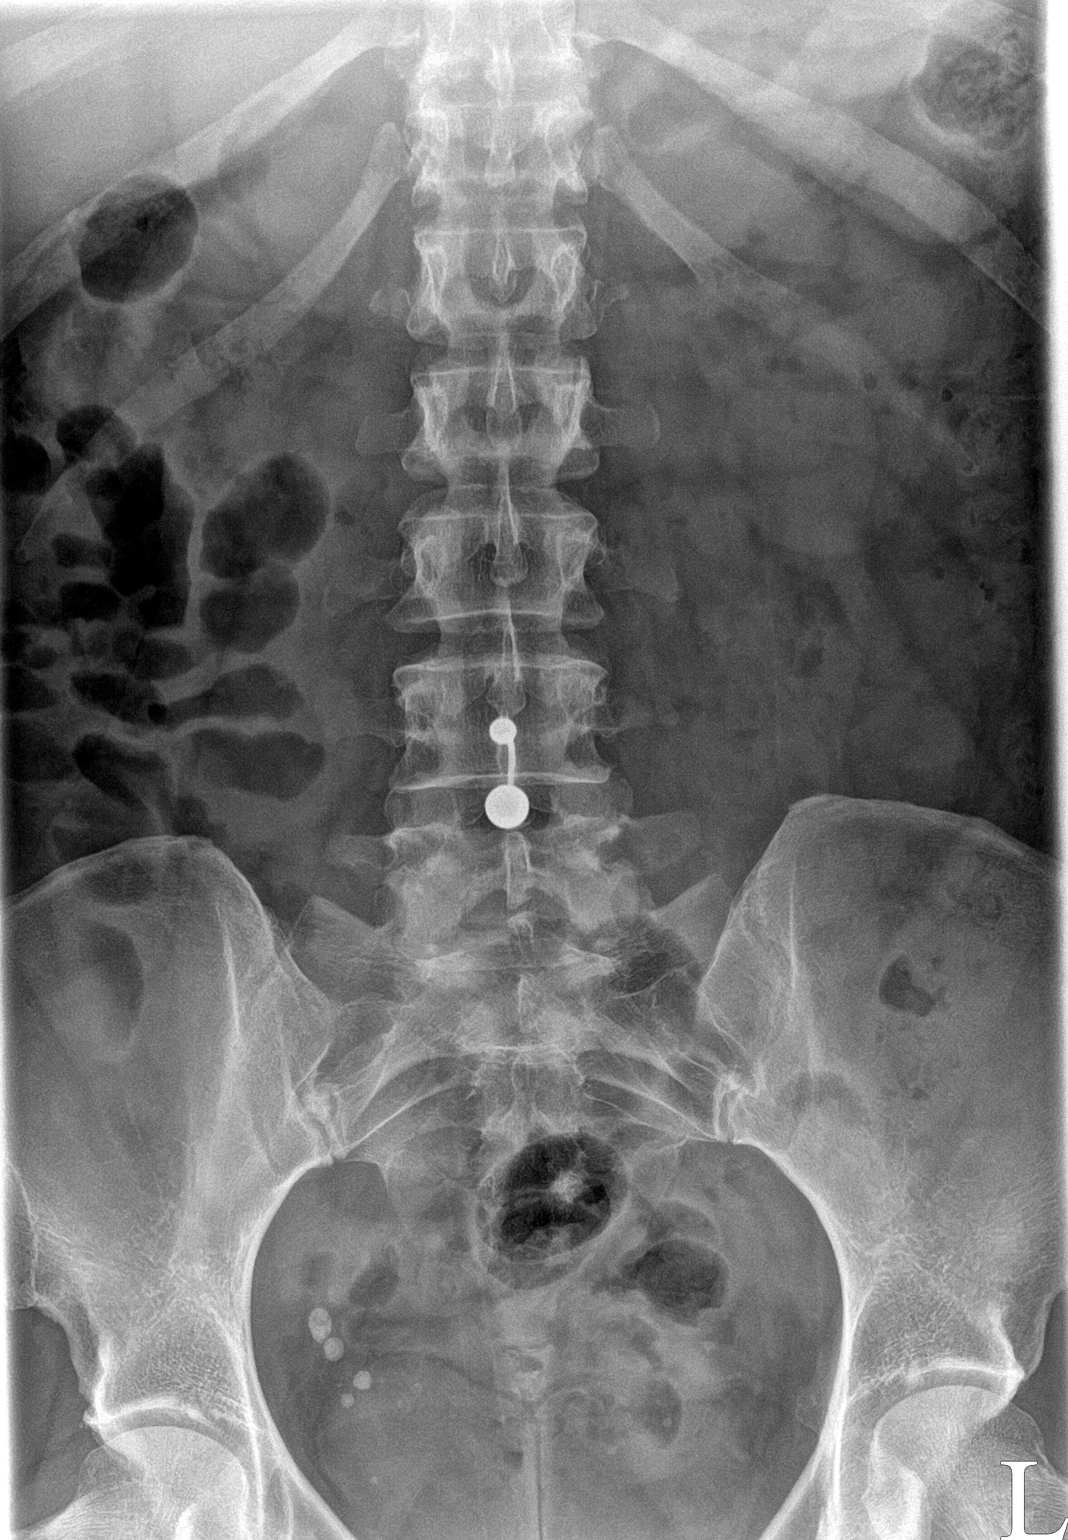

[l-spine lateral (1 of 2)]
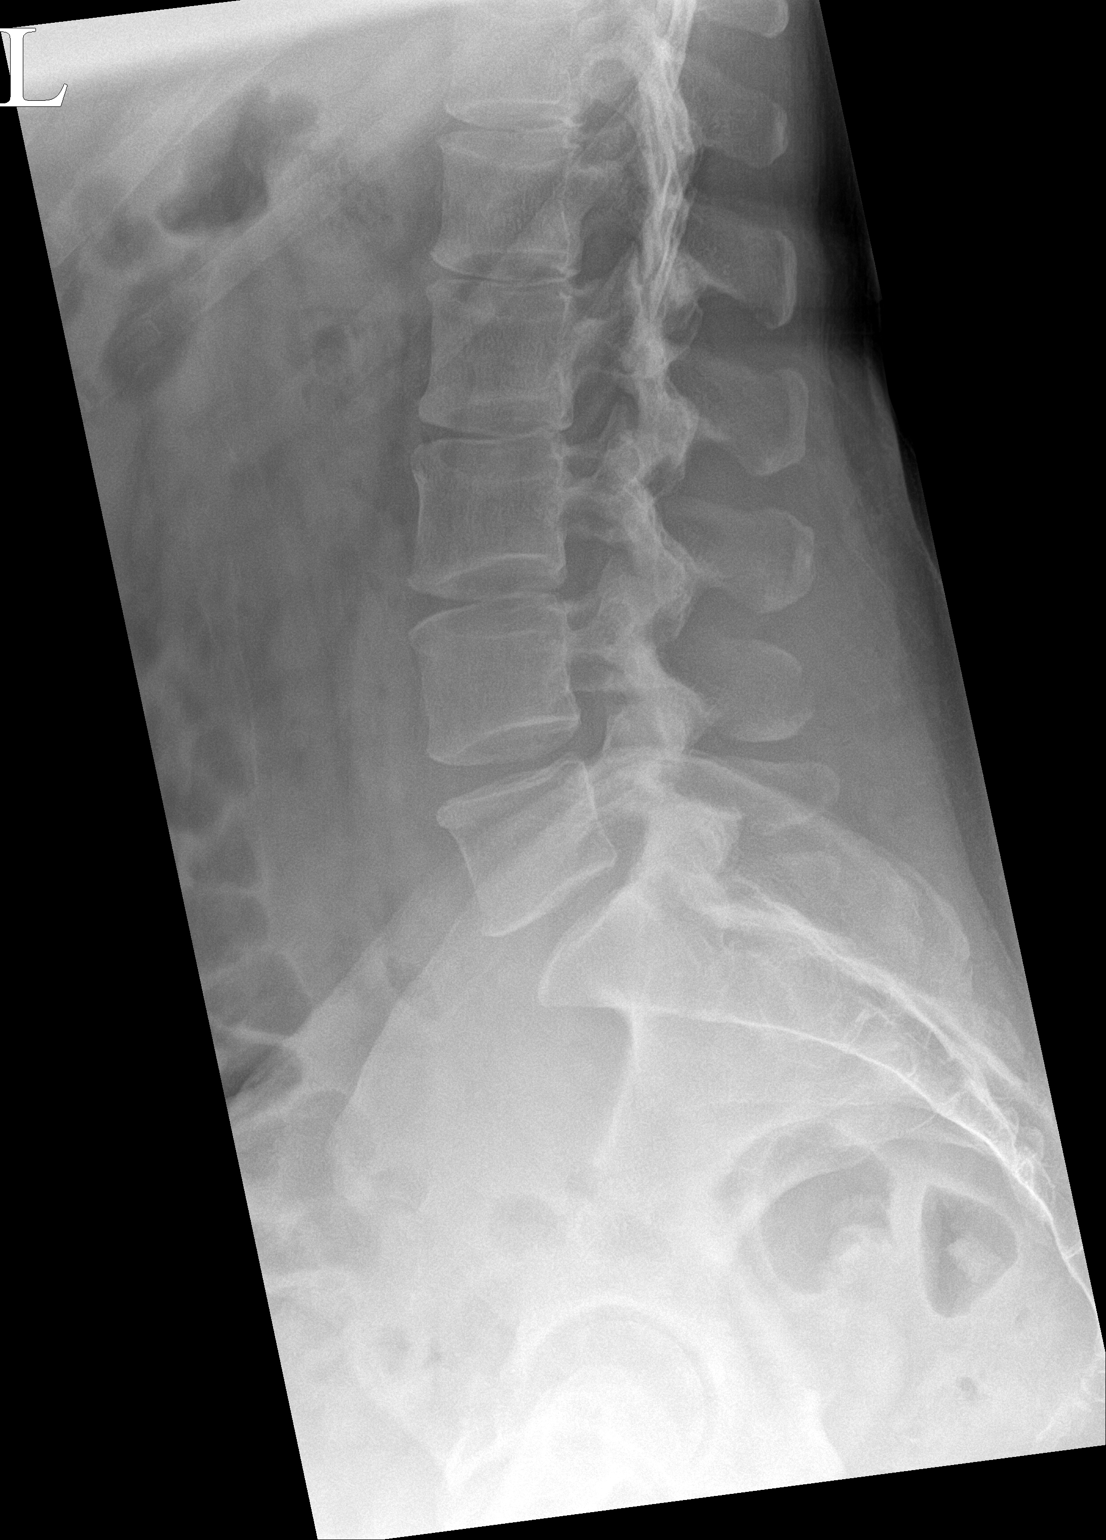

[l-spine lateral (2 of 2)]
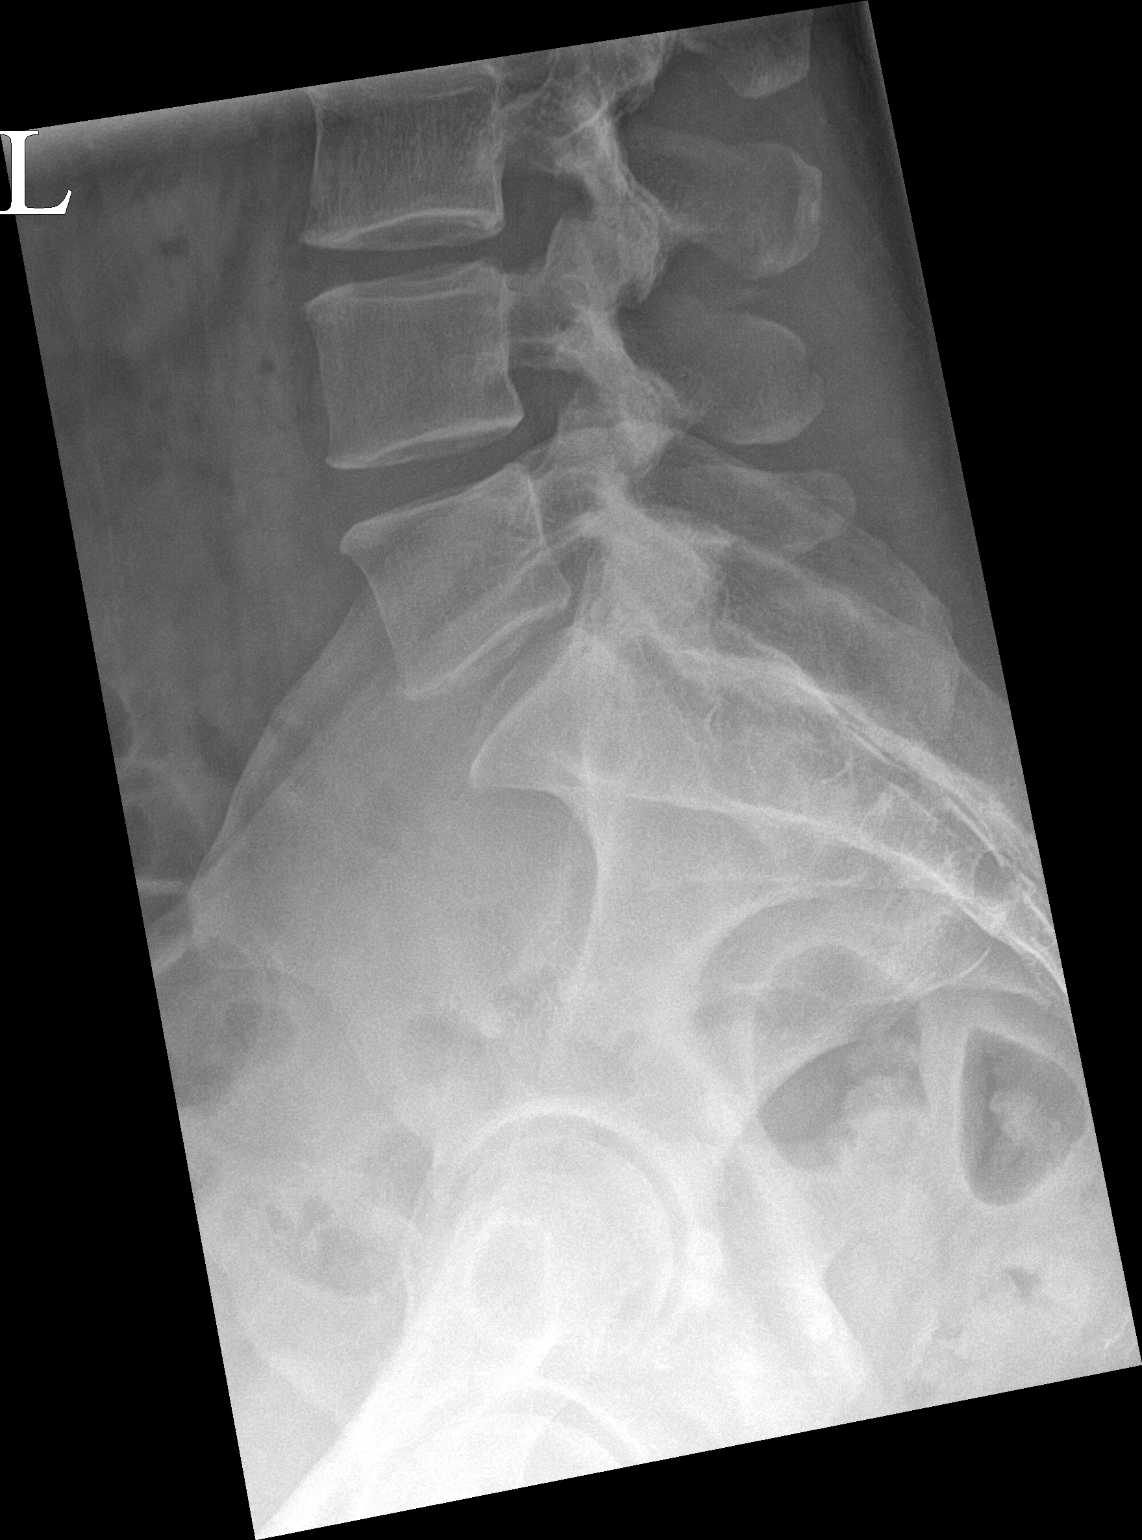

[3 of 3 positions shown; findings below may reference images not displayed]

FINDINGS: Five non-rib-bearing lumbar vertebra.

Vertebral body and disc space heights maintained.

No fracture, subluxation, or bone destruction.

SI joints preserved.

Scattered pelvic phleboliths.

Jewelry artifact at L4-L5.
IMPRESSION: No lumbar spine abnormalities.

## 2023-06-19 ENCOUNTER — Other Ambulatory Visit: Payer: Self-pay

## 2023-06-19 NOTE — Telephone Encounter (Signed)
Prior Authorization submitted for Lybalvi 10-10 mg with [employer]

## 2023-06-20 ENCOUNTER — Ambulatory Visit
Admission: EM | Admit: 2023-06-20 | Discharge: 2023-06-20 | Disposition: A | Discharge status: Home / Self Care | Payer: 59 | Attending: Internal Medicine | Admitting: Internal Medicine

## 2023-06-20 DIAGNOSIS — R52 Pain, unspecified: Secondary | ICD-10-CM | POA: Diagnosis not present

## 2023-06-20 DIAGNOSIS — R509 Fever, unspecified: Secondary | ICD-10-CM | POA: Diagnosis not present

## 2023-06-20 DIAGNOSIS — Z1152 Encounter for screening for COVID-19: Secondary | ICD-10-CM | POA: Diagnosis not present

## 2023-06-20 LAB — POCT INFLUENZA A/B
Influenza A, POC: NEGATIVE
Influenza B, POC: NEGATIVE

## 2023-06-20 LAB — POCT MONO SCREEN (KUC): Mono, POC: NEGATIVE

## 2023-06-20 MED ORDER — KETOROLAC TROMETHAMINE 30 MG/ML IJ SOLN
30.0000 mg | Freq: Once | INTRAMUSCULAR | Status: AC
  Administered 2023-06-20: 30 mg via INTRAMUSCULAR

## 2023-06-20 NOTE — ED Triage Notes (Addendum)
Pt presents with c/o fever x 4 days. Denies other sxs. States she has taken Advil dual action and took an Catering manager 1 hr ago. Had COVID 08/12, took a test at home and states it was neg.

## 2023-06-20 NOTE — ED Notes (Addendum)
While drawing blood, pt told me she wanted to remove the tourniquet, I asked her to not take it off. She said stated she knew how to do it and she knows blood will go faster if the tourniquet is removed. I told her it will be okay and I was almost finished. She proceeded to tell me that maybe I should learn how to do my job better and compared my way of drawing blood to other people that have done it before. She said it is her body. I paced a gauze and coban around her arm and she proceeded to take it off. Told her I would step out and have some one else come in.

## 2023-06-20 NOTE — ED Provider Notes (Signed)
[facility]    CSN: [id number] Arrival date & time: 06/20/23  1647      History   Chief Complaint Chief Complaint  Patient presents with   Fever    HPI Cynthia Lawrence is a 52 y.o. female  presents for evaluation of URI symptoms for 4 days. Patient reports associated symptoms of fever of 102 degrees, body aches, fatigue, diarrhea.  Patient reports "I just feel miserable".  Denies cough, congestion, ear pain, sore throat, nausea or vomiting, shortness of breath. Patient does not have a hx of asthma.  She thinks she sat on the couch recently with black mold on it.  She is very concerned about her fever as she states she as well as anyone in her family "never run fevers".  She states she had COVID in August with resolution of symptoms and no hospitalizations or complications.  She is requesting blood work to be drawn as well as mono testing.  Pt has taken Advil and Alka-Seltzer OTC for symptoms. Pt has no other concerns at this time.    Fever Associated symptoms: myalgias     Past Medical History:  Diagnosis Date   Allergy    Anxiety    Asthma    Chronic tonsillitis    Depression    Seasonal allergies     Patient Active Problem List   Diagnosis Date Noted   Contusion of heel, right, initial encounter 03/28/2023   Umbilical hernia without obstruction and without gangrene 12/28/2020   COVID-19 virus infection 06/06/2020   History of vertigo 12/11/2019   MDD (major depressive disorder) 08/11/2018   PTSD (post-traumatic stress disorder) 08/11/2018   Night terrors 08/11/2018   Bruxism 08/11/2018   Dysuria 12/28/2017   Healthcare maintenance 07/04/2017   Depression, recurrent (HCC) 07/04/2017    Past Surgical History:  Procedure Laterality Date   ENDOMETRIAL ABLATION     EYE SURGERY     lasik   TONSILLECTOMY Bilateral 08/15/2016   Procedure: TONSILLECTOMY;  Surgeon: Drema Halon, MD;  Location: [facility];  Service: ENT;  Laterality:  Bilateral;   WISDOM TOOTH EXTRACTION      OB History   No obstetric history on file.      Home Medications    Prior to Admission medications   Medication Sig Start Date End Date Taking? Authorizing Provider  albuterol (VENTOLIN HFA) 108 (90 Base) MCG/ACT inhaler  09/22/20   [provider]  amphetamine-dextroamphetamine (ADDERALL) 10 MG tablet Take 1 tablet (10 mg total) by mouth 2 (two) times daily with breakfast and lunch. 05/29/23   Cherie Ouch, PA-C  Cannabinoids (THC FREE PO) Take by mouth. Patient not taking: Reported on 05/29/2023    [provider]  cetirizine (ZYRTEC) 10 MG tablet Take 10 mg by mouth daily.    [provider]  doxylamine, Sleep, (UNISOM) 25 MG tablet Take 25 mg by mouth at bedtime as needed. Patient not taking: Reported on 05/29/2023    [provider]  fluticasone (FLONASE) 50 MCG/ACT nasal spray Place into both nostrils daily.    [provider]  Nutritional Supplements (JUICE PLUS FIBRE PO) Take by mouth daily. Patient not taking: Reported on 05/29/2023    [provider]  OLANZapine-Samidorphan (LYBALVI) 10-10 MG TABS Take 1 tablet by mouth at bedtime. 05/29/23   Melony Overly T, PA-C  Omega-3 Fatty Acids (OMEGA 3 500 PO) Take by mouth.    [provider]  pyridoxine (B-6) 100 MG tablet Take 100  mg by mouth daily.    [provider]  Sodium Hyaluronate, Viscosup, (GELSYN-3) 16.8 MG/2ML SOSY Inject 16.8 mg into the articular space once a week. Patient not taking: Reported on 05/29/2023 08/30/21   Rodolph Bong, MD  valACYclovir Ralph Dowdy) 1000 MG tablet  11/01/21   [provider]  vitamin B-12 (CYANOCOBALAMIN) 100 MCG tablet Take 100 mcg by mouth daily.    [provider]  VITAMIN D PO Take by mouth.    [provider]    Family History Family History  Problem Relation Age of Onset   Diabetes Mother    Stroke Mother    Diabetes Father    Healthy Sister     Healthy Brother    Healthy Daughter    Healthy Son    Healthy Sister    Healthy Daughter    Healthy Daughter     Social History Social History   Tobacco Use   Smoking status: Never   Smokeless tobacco: Never  Vaping Use   Vaping status: Never Used  Substance Use Topics   Alcohol use: Not Currently   Drug use: Yes    Types: Marijuana    Comment: occasionally     Allergies   Patient has no known allergies.   Review of Systems Review of Systems  Constitutional:  Positive for fatigue and fever.  Musculoskeletal:  Positive for myalgias.     Physical Exam Triage Vital Signs ED Triage Vitals  Encounter Vitals Group     BP 06/20/23 1720 130/84     Systolic BP Percentile --      Diastolic BP Percentile --      Pulse Rate 06/20/23 1720 79     Resp 06/20/23 1720 17     Temp 06/20/23 1720 98.2 F (36.8 C)     Temp Source 06/20/23 1720 Oral     SpO2 06/20/23 1720 97 %     Weight --      Height --      Head Circumference --      Peak Flow --      Pain Score 06/20/23 1719 0     Pain Loc --      Pain Education --      Exclude from Growth Chart --    No data found.  Updated Vital Signs BP 130/84 (BP Location: Right Arm)   Pulse 79   Temp 98.2 F (36.8 C) (Oral)   Resp 17   SpO2 97%   Visual Acuity Right Eye Distance:   Left Eye Distance:   Bilateral Distance:    Right Eye Near:   Left Eye Near:    Bilateral Near:     Physical Exam Vitals and nursing note reviewed.  Constitutional:      General: She is not in acute distress.    Appearance: She is well-developed. She is not ill-appearing.  HENT:     Head: Normocephalic and atraumatic.     Right Ear: Tympanic membrane and ear canal normal.     Left Ear: Tympanic membrane and ear canal normal.     Nose: No congestion.     Mouth/Throat:     Mouth: Mucous membranes are moist.     Pharynx: Oropharynx is clear. Uvula midline. No oropharyngeal exudate or posterior oropharyngeal erythema.     Tonsils: No  tonsillar exudate or tonsillar abscesses.  Eyes:     Conjunctiva/sclera: Conjunctivae normal.     Pupils: Pupils are equal, round, and reactive to  light.  Cardiovascular:     Rate and Rhythm: Normal rate and regular rhythm.     Heart sounds: Normal heart sounds.  Pulmonary:     Effort: Pulmonary effort is normal.     Breath sounds: Normal breath sounds.  Musculoskeletal:     Cervical back: Normal range of motion and neck supple.  Lymphadenopathy:     Cervical: No cervical adenopathy.  Skin:    General: Skin is warm and dry.  Neurological:     General: No focal deficit present.     Mental Status: She is alert and oriented to person, place, and time.  Psychiatric:        Mood and Affect: Mood normal.        Behavior: Behavior normal.      UC Treatments / Results  Labs (all labs ordered are listed, but only abnormal results are displayed) Labs Reviewed  SARS CORONAVIRUS 2 (TAT 6-24 HRS)  CBC WITH DIFFERENTIAL/PLATELET  POCT INFLUENZA A/B  POCT MONO SCREEN ([facility])    EKG   Radiology No results found.  Procedures Procedures (including critical care time)  Medications Ordered in UC Medications  ketorolac (TORADOL) 30 MG/ML injection 30 mg (30 mg Intramuscular Given 06/20/23 1819)    Initial Impression / Assessment and Plan / UC Course  I have reviewed the triage vital signs and the nursing notes.  Pertinent labs & imaging results that were available during my care of the patient were reviewed by me and considered in my medical decision making (see chart for details).     Patient given Toradol injection in clinic due to her body aches.  She was monitored for 10 minutes after injection with no reaction noted and tolerated well.  She is instructed no NSAIDs for 24 hours and she verbalized understanding.  Negative mono and flu.  COVID PCR and CBC (done per pt request)and will contact if positive.  Patient crying in room stating that she has never had a fever in her entire  life and her body just hurts so bad.  Discussed ER evaluation and she states she will talk to her dad to see if that is what she wants to do.  Otherwise advise rest and fluids/symptomatic treatment.  PCP follow-up 2 days for recheck.  ER precautions reviewed. Final Clinical Impressions(s) / UC Diagnoses   Final diagnoses:  Body aches  Fever, unspecified     Discharge Instructions      You were given a Toradol injection in clinic today. Do not take any over the counter NSAID's such as Advil, ibuprofen, Aleve, or naproxen for 24 hours. You may take tylenol if needed The clinic will contact you with results of the COVID test done CBC done today if positive.  Lots of rest and fluids.  Please follow-up with your PCP in 2 days for recheck.  Please go to the ER for any worsening symptoms.  I hope you feel better soon!     ED Prescriptions   None    PDMP not reviewed this encounter.   Radford Pax, NP 06/20/23 1836

## 2023-06-20 NOTE — Telephone Encounter (Signed)
This was denied. Will inform Rosey Bath of requirements

## 2023-06-20 NOTE — Discharge Instructions (Signed)
You were given a Toradol injection in clinic today. Do not take any over the counter NSAID's such as Advil, ibuprofen, Aleve, or naproxen for 24 hours. You may take tylenol if needed The clinic will contact you with results of the COVID test done CBC done today if positive.  Lots of rest and fluids.  Please follow-up with your PCP in 2 days for recheck.  Please go to the ER for any worsening symptoms.  I hope you feel better soon!

## 2023-06-21 LAB — CBC WITH DIFFERENTIAL/PLATELET
Basophils Absolute: 0 10*3/uL (ref 0.0–0.2)
Basos: 0 %
EOS (ABSOLUTE): 0.1 10*3/uL (ref 0.0–0.4)
Eos: 1 %
Hematocrit: 35.5 % (ref 34.0–46.6)
Hemoglobin: 11.6 g/dL (ref 11.1–15.9)
Immature Grans (Abs): 0.1 10*3/uL (ref 0.0–0.1)
Immature Granulocytes: 1 %
Lymphocytes Absolute: 1.5 10*3/uL (ref 0.7–3.1)
Lymphs: 13 %
MCH: 29.1 pg (ref 26.6–33.0)
MCHC: 32.7 g/dL (ref 31.5–35.7)
MCV: 89 fL (ref 79–97)
Monocytes Absolute: 1.2 10*3/uL — ABNORMAL HIGH (ref 0.1–0.9)
Monocytes: 10 %
Neutrophils Absolute: 9 10*3/uL — ABNORMAL HIGH (ref 1.4–7.0)
Neutrophils: 75 %
Platelets: 271 10*3/uL (ref 150–450)
RBC: 3.99 x10E6/uL (ref 3.77–5.28)
RDW: 12.6 % (ref 11.7–15.4)
WBC: 11.9 10*3/uL — ABNORMAL HIGH (ref 3.4–10.8)

## 2023-06-21 LAB — SARS CORONAVIRUS 2 (TAT 6-24 HRS): SARS Coronavirus 2: NEGATIVE

## 2023-07-10 ENCOUNTER — Encounter: Payer: Self-pay | Admitting: Physician Assistant

## 2023-07-10 ENCOUNTER — Ambulatory Visit (INDEPENDENT_AMBULATORY_CARE_PROVIDER_SITE_OTHER): Payer: 59 | Admitting: Physician Assistant

## 2023-07-10 DIAGNOSIS — F4310 Post-traumatic stress disorder, unspecified: Secondary | ICD-10-CM | POA: Diagnosis not present

## 2023-07-10 DIAGNOSIS — F331 Major depressive disorder, recurrent, moderate: Secondary | ICD-10-CM

## 2023-07-10 DIAGNOSIS — F900 Attention-deficit hyperactivity disorder, predominantly inattentive type: Secondary | ICD-10-CM

## 2023-07-10 MED ORDER — AMPHETAMINE-DEXTROAMPHETAMINE 10 MG PO TABS: 10.0000 mg | ORAL_TABLET | Freq: Two times a day (BID) | ORAL | 0 refills | Status: AC

## 2023-07-10 MED ORDER — BUPROPION HCL ER (XL) 150 MG PO TB24: 150.0000 mg | ORAL_TABLET | Freq: Every day | ORAL | 1 refills | Status: AC
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
## 2023-07-12 NOTE — Progress Notes (Unsigned)
[facility] [address] [place] [address] Phone: [phone] Subjective:   [name], am serving as a scribe for Dr. Antoine Primas.  I'm seeing this patient by the request  of:  Rivard, Dois Davenport, MD  CC: right thumb pain   [id number]  03/28/2023 Patient has a heel contusion on the right side.  Seems to have fat pad irritation.  I do not see any cortical defect of the calcaneal area.  We discussed with patient about different treatment options, discussed which activities to do and which ones to avoid.  Increase activity slowly.  Follow-up with me again after prednisone, proper shoes, follow-up again in 6 to 8 weeks.     Update 07/16/2023 Cynthia Lawrence is a 52 y.o. female coming in with complaint of R thumb pain. Patient states only pain when pressure on IP joint. No homes therapies. Also neck pain since March 2021 that has progressively gotten worse.      Past Medical History:  Diagnosis Date   Allergy    Anxiety    Asthma    Chronic tonsillitis    Depression    Seasonal allergies    Past Surgical History:  Procedure Laterality Date   ENDOMETRIAL ABLATION     EYE SURGERY     lasik   TONSILLECTOMY Bilateral 08/15/2016   Procedure: TONSILLECTOMY;  Surgeon: Drema Halon, MD;  Location: [facility];  Service: ENT;  Laterality: Bilateral;   WISDOM TOOTH EXTRACTION     Social History   Socioeconomic History   Marital status: Married    Spouse name: Not on file   Number of children: Not on file   Years of education: Not on file   Highest education level: Not on file  Occupational History   Not on file  Tobacco Use   Smoking status: Never   Smokeless tobacco: Never  Vaping Use   Vaping status: Never Used  Substance and Sexual Activity   Alcohol use: Not Currently   Drug use: Yes    Types: Marijuana    Comment: occasionally   Sexual activity: Yes    Birth control/protection: Surgical    Comment:  ablation  Other Topics Concern   Not on file  Social History Narrative   Left handed   Lives family in one story home,.   Social Determinants of Health   Financial Resource Strain: Not on file  Food Insecurity: Not on file  Transportation Needs: Not on file  Physical Activity: Not on file  Stress: Not on file  Social Connections: Not on file   No Known Allergies Family History  Problem Relation Age of Onset   Diabetes Mother    Stroke Mother    Diabetes Father    Healthy Sister    Healthy Brother    Healthy Daughter    Healthy Son    Healthy Sister    Healthy Daughter    Healthy Daughter       Current Outpatient Medications (Respiratory):    albuterol (VENTOLIN HFA) 108 (90 Base) MCG/ACT inhaler,    cetirizine (ZYRTEC) 10 MG tablet, Take 10 mg by mouth daily.   fluticasone (FLONASE) 50 MCG/ACT nasal spray, Place into both nostrils daily.   Current Outpatient Medications (Hematological):    vitamin B-12 (CYANOCOBALAMIN) 100 MCG tablet, Take 100 mcg by mouth daily.  Current Outpatient Medications (Other):    amphetamine-dextroamphetamine (ADDERALL) 10 MG tablet, Take 1 tablet (10 mg total) by mouth 2 (two)  times daily with breakfast and lunch.   buPROPion (WELLBUTRIN XL) 150 MG 24 hr tablet, Take 1 tablet (150 mg total) by mouth daily.   Cannabinoids (THC FREE PO), Take by mouth. (Patient not taking: Reported on 05/29/2023)   doxylamine, Sleep, (UNISOM) 25 MG tablet, Take 25 mg by mouth at bedtime as needed. (Patient not taking: Reported on 05/29/2023)   Nutritional Supplements (JUICE PLUS FIBRE PO), Take by mouth daily. (Patient not taking: Reported on 05/29/2023)   Omega-3 Fatty Acids (OMEGA 3 500 PO), Take by mouth.   pyridoxine (B-6) 100 MG tablet, Take 100 mg by mouth daily.   Sodium Hyaluronate, Viscosup, (GELSYN-3) 16.8 MG/2ML SOSY, Inject 16.8 mg into the articular space once a week. (Patient not taking: Reported on 05/29/2023)   valACYclovir (VALTREX) 1000 MG tablet,     VITAMIN D PO, Take by mouth.   Reviewed prior external information including notes and imaging from  primary care provider As well as notes that were available from care everywhere and other healthcare systems.  Past medical history, social, surgical and family history all reviewed in electronic medical record.  No pertanent information unless stated regarding to the chief complaint.   Review of Systems:  No headache, visual changes, nausea, vomiting, diarrhea, constipation, dizziness, abdominal pain, skin rash, fevers, chills, night sweats, weight loss, swollen lymph nodes, body aches, joint swelling, chest pain, shortness of breath, mood changes. POSITIVE muscle aches  Objective  Blood pressure 116/82, pulse 66, height 5\' 3"  (1.6 m), weight 180 lb (81.6 kg), SpO2 96%.   General: No apparent distress alert and oriented x3 mood and affect normal, dressed appropriately.  HEENT: Pupils equal, extraocular movements intact  Respiratory: Patient's speak in full sentences and does not appear short of breath  Cardiovascular: No lower extremity edema, non tender, no erythema  Right thumb exam shows patient does have some mild swelling noted over the IP compared to the contralateral side.  Mild tenderness just distal to the IP joint.  Patient does have tightness noted with certain movements.  Patient though does have good grip strength still noted.  Neurovascularly intact distally.  CMC joint appears to be unremarkable. Neck exam does have some mild loss of lordosis.  Poor posture noted.  Relatively good range of motion but lacking the last 5 degrees of flexion in the last 5 degrees of extension.  Good sidebending and rotation.  Posterior lymphadenopathy palpated.  Limited muscular skeletal ultrasound was performed and interpreted by Antoine Primas, M  Limited ultrasound of patient's right thumb seems to have some hypoechoic changes that is on the radial aspect of the IP joint.  Could be consistent  with a hematoma.  No cortical irregularity noted. Impression: Hematoma resolving from an injury on right thumb    Impression and Recommendations:    The above documentation has been reviewed and is accurate and complete Judi Saa, DO

## 2023-07-16 ENCOUNTER — Ambulatory Visit (INDEPENDENT_AMBULATORY_CARE_PROVIDER_SITE_OTHER): Payer: 59

## 2023-07-16 ENCOUNTER — Ambulatory Visit: Payer: 59 | Admitting: Family Medicine

## 2023-07-16 ENCOUNTER — Encounter: Payer: Self-pay | Admitting: Family Medicine

## 2023-07-16 ENCOUNTER — Other Ambulatory Visit: Payer: Self-pay

## 2023-07-16 VITALS — BP 116/82 | HR 66 | Ht 63.0 in | Wt 180.0 lb

## 2023-07-16 DIAGNOSIS — M79644 Pain in right finger(s): Secondary | ICD-10-CM | POA: Diagnosis not present

## 2023-07-16 DIAGNOSIS — M2550 Pain in unspecified joint: Secondary | ICD-10-CM | POA: Diagnosis not present

## 2023-07-16 DIAGNOSIS — M542 Cervicalgia: Secondary | ICD-10-CM

## 2023-07-16 DIAGNOSIS — M4802 Spinal stenosis, cervical region: Secondary | ICD-10-CM | POA: Diagnosis not present

## 2023-07-16 DIAGNOSIS — G8929 Other chronic pain: Secondary | ICD-10-CM | POA: Diagnosis not present

## 2023-07-16 DIAGNOSIS — M5030 Other cervical disc degeneration, unspecified cervical region: Secondary | ICD-10-CM | POA: Diagnosis not present

## 2023-07-16 DIAGNOSIS — M47812 Spondylosis without myelopathy or radiculopathy, cervical region: Secondary | ICD-10-CM | POA: Diagnosis not present

## 2023-07-16 LAB — CBC WITH DIFFERENTIAL/PLATELET
Basophils Absolute: 0.1 10*3/uL (ref 0.0–0.1)
Basophils Relative: 1.1 % (ref 0.0–3.0)
Eosinophils Absolute: 0.1 10*3/uL (ref 0.0–0.7)
Eosinophils Relative: 2.5 % (ref 0.0–5.0)
HCT: 40.3 % (ref 36.0–46.0)
Hemoglobin: 13.1 g/dL (ref 12.0–15.0)
Lymphocytes Relative: 24.9 % (ref 12.0–46.0)
Lymphs Abs: 1.3 10*3/uL (ref 0.7–4.0)
MCHC: 32.6 g/dL (ref 30.0–36.0)
MCV: 88.6 fL (ref 78.0–100.0)
Monocytes Absolute: 0.5 10*3/uL (ref 0.1–1.0)
Monocytes Relative: 10.1 % (ref 3.0–12.0)
Neutro Abs: 3.3 10*3/uL (ref 1.4–7.7)
Neutrophils Relative %: 61.4 % (ref 43.0–77.0)
Platelets: 267 10*3/uL (ref 150.0–400.0)
RBC: 4.55 Mil/uL (ref 3.87–5.11)
RDW: 14.8 % (ref 11.5–15.5)
WBC: 5.3 10*3/uL (ref 4.0–10.5)

## 2023-07-16 LAB — COMPREHENSIVE METABOLIC PANEL
ALT: 22 U/L (ref 0–35)
AST: 21 U/L (ref 0–37)
Albumin: 4.4 g/dL (ref 3.5–5.2)
Alkaline Phosphatase: 62 U/L (ref 39–117)
BUN: 12 mg/dL (ref 6–23)
CO2: 25 meq/L (ref 19–32)
Calcium: 9.8 mg/dL (ref 8.4–10.5)
Chloride: 106 meq/L (ref 96–112)
Creatinine, Ser: 0.95 mg/dL (ref 0.40–1.20)
GFR: 69.96 mL/min (ref >60.00)
Glucose, Bld: 107 mg/dL — ABNORMAL HIGH (ref 70–99)
Potassium: 3.9 meq/L (ref 3.5–5.1)
Sodium: 140 meq/L (ref 135–145)
Total Bilirubin: 0.8 mg/dL (ref 0.2–1.2)
Total Protein: 7.1 g/dL (ref 6.0–8.3)

## 2023-07-16 NOTE — Assessment & Plan Note (Signed)
Pain in the right thumb.  Discussed icing regimen and home exercises.  Discussed because there is a small hematoma potentially heat and massage.  Discussed with the possibility of topical over-the-counter medications that could be helpful.  Worsening pain can consider aspiration but do not think will be necessary.
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
## 2023-07-16 NOTE — Patient Instructions (Addendum)
Warm compress and massage Arnica lotion Labs today Xray today If labs still elevated will consider antibiotic and steroids Do prescribed exercises at least 3x a week See you again in 6-8 weeks

## 2023-08-07 ENCOUNTER — Ambulatory Visit (INDEPENDENT_AMBULATORY_CARE_PROVIDER_SITE_OTHER): Payer: 59 | Admitting: Physician Assistant

## 2023-09-03 ENCOUNTER — Telehealth: Payer: Self-pay | Admitting: Physician Assistant

## 2023-09-03 NOTE — Telephone Encounter (Signed)
Pt LVM 12/8 @ 12:04p.  She called to cancel her appt on 12/12.  She said she is not taking any meds and is doing well, so she doesn't need to come for an appt.

## 2023-09-05 ENCOUNTER — Ambulatory Visit: Payer: 59 | Admitting: Family Medicine

## 2023-09-06 ENCOUNTER — Ambulatory Visit: Payer: 59 | Admitting: Physician Assistant

## 2023-10-25 ENCOUNTER — Other Ambulatory Visit: Payer: Self-pay | Admitting: Obstetrics and Gynecology

## 2023-10-25 DIAGNOSIS — Z1231 Encounter for screening mammogram for malignant neoplasm of breast: Secondary | ICD-10-CM

## 2023-11-19 ENCOUNTER — Ambulatory Visit: Payer: 59 | Admitting: Sports Medicine

## 2023-11-22 NOTE — Progress Notes (Unsigned)
    [facility] [address] [place] [address] Phone: [phone]   Assessment and Plan:     There are no diagnoses linked to this encounter.  ***   Pertinent previous records reviewed include ***    Follow Up: ***     Subjective:   I, Darin Arndt, am serving as a Neurosurgeon for Doctor Richardean Sale  Chief Complaint: left knee pain   HPI:  11/23/2023 Patient is a 53 year old female with left knee pain. Patient states  Relevant Historical Information: ***  Additional pertinent review of systems negative.   Current Outpatient Medications:    albuterol (VENTOLIN HFA) 108 (90 Base) MCG/ACT inhaler, , Disp: , Rfl:    amphetamine-dextroamphetamine (ADDERALL) 10 MG tablet, Take 1 tablet (10 mg total) by mouth 2 (two) times daily with breakfast and lunch., Disp: 60 tablet, Rfl: 0   buPROPion (WELLBUTRIN XL) 150 MG 24 hr tablet, Take 1 tablet (150 mg total) by mouth daily., Disp: 30 tablet, Rfl: 1   Cannabinoids (THC FREE PO), Take by mouth. (Patient not taking: Reported on 05/29/2023), Disp: , Rfl:    cetirizine (ZYRTEC) 10 MG tablet, Take 10 mg by mouth daily., Disp: , Rfl:    doxylamine, Sleep, (UNISOM) 25 MG tablet, Take 25 mg by mouth at bedtime as needed. (Patient not taking: Reported on 05/29/2023), Disp: , Rfl:    fluticasone (FLONASE) 50 MCG/ACT nasal spray, Place into both nostrils daily., Disp: , Rfl:    Nutritional Supplements (JUICE PLUS FIBRE PO), Take by mouth daily. (Patient not taking: Reported on 05/29/2023), Disp: , Rfl:    Omega-3 Fatty Acids (OMEGA 3 500 PO), Take by mouth., Disp: , Rfl:    pyridoxine (B-6) 100 MG tablet, Take 100 mg by mouth daily., Disp: , Rfl:    Sodium Hyaluronate, Viscosup, (GELSYN-3) 16.8 MG/2ML SOSY, Inject 16.8 mg into the articular space once a week. (Patient not taking: Reported on 05/29/2023), Disp: 6.09 mL, Rfl: 0   valACYclovir (VALTREX) 1000 MG tablet, , Disp: , Rfl:    vitamin B-12  (CYANOCOBALAMIN) 100 MCG tablet, Take 100 mcg by mouth daily., Disp: , Rfl:    VITAMIN D PO, Take by mouth., Disp: , Rfl:    Objective:     There were no vitals filed for this visit.    There is no height or weight on file to calculate BMI.    Physical Exam:    ***   Electronically signed by:  Aleen Sells D.[facility] 7:33 AM 11/22/23

## 2023-11-23 ENCOUNTER — Other Ambulatory Visit: Payer: Self-pay

## 2023-11-23 ENCOUNTER — Ambulatory Visit: Payer: Self-pay | Admitting: Sports Medicine

## 2023-11-23 VITALS — BP 120/82 | Ht 63.0 in | Wt 183.0 lb

## 2023-11-23 DIAGNOSIS — M25562 Pain in left knee: Secondary | ICD-10-CM

## 2023-11-23 DIAGNOSIS — G8929 Other chronic pain: Secondary | ICD-10-CM

## 2023-11-23 NOTE — Patient Instructions (Signed)
 Knee HEP  Tylenol as needed  4 week follow up

## 2023-12-21 ENCOUNTER — Ambulatory Visit: Payer: Self-pay | Admitting: Sports Medicine

## 2024-03-12 ENCOUNTER — Other Ambulatory Visit: Payer: Self-pay

## 2024-03-12 ENCOUNTER — Ambulatory Visit (INDEPENDENT_AMBULATORY_CARE_PROVIDER_SITE_OTHER)

## 2024-03-12 ENCOUNTER — Ambulatory Visit: Admitting: Family Medicine

## 2024-03-12 ENCOUNTER — Encounter: Payer: Self-pay | Admitting: Family Medicine

## 2024-03-12 VITALS — BP 126/84 | HR 87 | Ht 63.0 in | Wt 181.0 lb

## 2024-03-12 DIAGNOSIS — M25562 Pain in left knee: Secondary | ICD-10-CM | POA: Diagnosis not present

## 2024-03-12 DIAGNOSIS — M79672 Pain in left foot: Secondary | ICD-10-CM | POA: Diagnosis not present

## 2024-03-12 DIAGNOSIS — G8929 Other chronic pain: Secondary | ICD-10-CM | POA: Diagnosis not present

## 2024-03-12 DIAGNOSIS — M25872 Other specified joint disorders, left ankle and foot: Secondary | ICD-10-CM

## 2024-03-12 DIAGNOSIS — M2352 Chronic instability of knee, left knee: Secondary | ICD-10-CM | POA: Diagnosis not present

## 2024-03-12 DIAGNOSIS — M25531 Pain in right wrist: Secondary | ICD-10-CM | POA: Diagnosis not present

## 2024-03-12 DIAGNOSIS — M67431 Ganglion, right wrist: Secondary | ICD-10-CM

## 2024-03-12 NOTE — Assessment & Plan Note (Signed)
 Sesamoiditis noted.  Discussed icing regimen and home exercises, discussed which activities to do and which ones to avoid.  Increase activity slowly.  Discussed proper shoes and over-the-counter orthotics.  The carbon fiber plate.  Worsening pain can consider injection.

## 2024-03-12 NOTE — Assessment & Plan Note (Signed)
 Aspiration done today.  Got mostly jellylike material out.  Did rupture and go into the soft tissue area of some of the cyst material.  Discussed the possibility of return and potentially needing surgical intervention.  With hopeful patient will continue to do well.  Had improvement in range of motion immediately.

## 2024-03-12 NOTE — Patient Instructions (Signed)
 Carbon fiber plate Xray today [facility] [phone] Call Today  When we receive your results we will contact you.

## 2024-03-12 NOTE — Assessment & Plan Note (Signed)
 Recurrent left knee instability.  Discussed with patient about the significant swelling noted in the patellofemoral joint.  Patient has seen other providers for this.  Will get repeat x-rays but previous x-rays show just mild arthritis of the patellofemoral joint.  Concern with patient having a positive McMurray's, failing all other conservative therapy that I do think that there is a possibility of a meniscal injury as well.  Depending on findings we will discuss further management.
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
## 2024-03-13 ENCOUNTER — Telehealth: Payer: Self-pay | Admitting: Family Medicine

## 2024-03-13 ENCOUNTER — Other Ambulatory Visit: Payer: Self-pay | Admitting: Family Medicine

## 2024-03-13 DIAGNOSIS — G8929 Other chronic pain: Secondary | ICD-10-CM

## 2024-03-13 NOTE — Telephone Encounter (Signed)
 Spoke with patient. Let her know that numbing medication should wear off in a couple of days. She stated she woke up in pain, but her wrist was numb. There was no redness or swelling. She was asked to contact us  tomorrow or Monday to give us  an update. She wants it noted in her chart that she no longer wants numbing medication or steroids injected.

## 2024-03-13 NOTE — Telephone Encounter (Signed)
 Patient called and the place that she was sent to get an MRI called her and let her know they did not accept her insurance. Her hand is also still numb from draining the cyst. She has asked to not use numbing injection unless absolutely necessary. She says it is not wearing off and she would like a nurse to call her regarding this. Please advise.

## 2024-03-14 ENCOUNTER — Telehealth: Payer: Self-pay

## 2024-03-14 NOTE — Addendum Note (Signed)
 Addended by: Sheria Dills on: 03/14/2024 07:54 AM   Modules accepted: Orders

## 2024-03-14 NOTE — Telephone Encounter (Signed)
 Patient has [employer] insurance which [facility] is not in network and [facility] refuses to take this insurance. I usually send them to [facility] location is that okay?

## 2024-03-18 NOTE — Addendum Note (Signed)
 Addended by: GEROME ASHLEY SAUNDERS on: 03/18/2024 08:57 AM   Modules accepted: Orders

## 2024-04-04 ENCOUNTER — Telehealth: Payer: Self-pay | Admitting: Family Medicine

## 2024-04-04 NOTE — Telephone Encounter (Signed)
 Patient called stating that she was previously seen for her left knee and Dr Claudene recommended an MRI. She said that she is now having more pain in her left hip. She asked if an xray and MRI could be ordered for her hip so she didn't waste time just doing the knee or if Dr Claudene would like to see her?  Please advise.

## 2024-04-07 ENCOUNTER — Other Ambulatory Visit: Payer: Self-pay | Admitting: Family Medicine

## 2024-04-07 DIAGNOSIS — M25552 Pain in left hip: Secondary | ICD-10-CM

## 2024-04-09 ENCOUNTER — Telehealth: Payer: Self-pay

## 2024-04-09 NOTE — Telephone Encounter (Signed)
 FYI  Hip MRI ordered for patient will need to have an office visit in order to provide clinical documentation for insurance to approve this. I see that an x ray has been ordered as well, that will also have to be done and read before I can start the MRI approval.

## 2024-05-29 ENCOUNTER — Other Ambulatory Visit: Payer: Self-pay

## 2024-05-29 ENCOUNTER — Ambulatory Visit (INDEPENDENT_AMBULATORY_CARE_PROVIDER_SITE_OTHER): Admitting: Family Medicine

## 2024-05-29 ENCOUNTER — Encounter: Payer: Self-pay | Admitting: Family Medicine

## 2024-05-29 VITALS — BP 112/80 | HR 72 | Ht 63.0 in | Wt 185.0 lb

## 2024-05-29 DIAGNOSIS — M67431 Ganglion, right wrist: Secondary | ICD-10-CM | POA: Diagnosis not present

## 2024-05-29 DIAGNOSIS — M25532 Pain in left wrist: Secondary | ICD-10-CM

## 2024-05-29 NOTE — Patient Instructions (Addendum)
 Aspirated wrist today

## 2024-05-29 NOTE — Assessment & Plan Note (Signed)
 Patient tolerated the procedure well without any anesthesia.  Patient does not want to have any anesthesia such as Marcaine or lidocaine  in the future either.  I did have significant removal of material and discussed if it did come back too fast would need to consider surgical intervention.  Discussed any signs and symptoms that would be consistent with infectious and when to seek medical attention.  Patient is in agreement with the plan.  Follow-up with me again in 6 to 8 weeks otherwise.  Or as needed if it does not return

## 2024-05-29 NOTE — Progress Notes (Addendum)
 [facility] [address] [place] [address] Phone: [phone] Subjective:   [facility], am serving as a scribe for Dr. Arthea Claudene.  I'm seeing this patient by the request  of:  Rivard, Nena, MD  CC: Wrist pain follow-up, left knee and left hip pain YEP:Dlagzrupcz  Cynthia [facility] is a 53 y.o. female coming in with complaint of right wrist pain.  Patient was seen previously and given an injection into the wrist did appear to be a ganglion cyst. Pain is worsening and cyst did not go away.      Left knee did have x-ray showing moderate joint effusion with mild to moderate patellofemoral and medial joint arthritis.  Past Medical History:  Diagnosis Date   Allergy    Anxiety    Asthma    Chronic tonsillitis    Depression    Seasonal allergies    Past Surgical History:  Procedure Laterality Date   ENDOMETRIAL ABLATION     EYE SURGERY     lasik   TONSILLECTOMY Bilateral 08/15/2016   Procedure: TONSILLECTOMY;  Surgeon: Lonni FORBES Angle, MD;  Location: [facility];  Service: ENT;  Laterality: Bilateral;   WISDOM TOOTH EXTRACTION     Social History   Socioeconomic History   Marital status: Married    Spouse name: Not on file   Number of children: Not on file   Years of education: Not on file   Highest education level: Not on file  Occupational History   Not on file  Tobacco Use   Smoking status: Never   Smokeless tobacco: Never  Vaping Use   Vaping status: Never Used  Substance and Sexual Activity   Alcohol use: Not Currently   Drug use: Yes    Types: Marijuana    Comment: occasionally   Sexual activity: Yes    Birth control/protection: Surgical    Comment: ablation  Other Topics Concern   Not on file  Social History Narrative   Left handed   Lives family in one story home,.   Social Drivers of Corporate investment banker Strain: Not on file  Food Insecurity: Not on file  Transportation Needs:  Not on file  Physical Activity: Not on file  Stress: Not on file  Social Connections: Not on file   No Known Allergies Family History  Problem Relation Age of Onset   Diabetes Mother    Stroke Mother    Diabetes Father    Healthy Sister    Healthy Brother    Healthy Daughter    Healthy Son    Healthy Sister    Healthy Daughter    Healthy Daughter       Current Outpatient Medications (Respiratory):    albuterol  (VENTOLIN  HFA) 108 (90 Base) MCG/ACT inhaler,    cetirizine (ZYRTEC) 10 MG tablet, Take 10 mg by mouth daily.   fluticasone (FLONASE) 50 MCG/ACT nasal spray, Place into both nostrils daily.   Current Outpatient Medications (Hematological):    vitamin B-12 (CYANOCOBALAMIN) 100 MCG tablet, Take 100 mcg by mouth daily.  Current Outpatient Medications (Other):    amphetamine -dextroamphetamine  (ADDERALL) 10 MG tablet, Take 1 tablet (10 mg total) by mouth 2 (two) times daily with breakfast and lunch.   buPROPion  (WELLBUTRIN  XL) 150 MG 24 hr tablet, Take 1 tablet (150 mg total) by mouth daily.   Cannabinoids (THC FREE PO), Take by mouth.   doxylamine, Sleep, (UNISOM) 25 MG tablet, Take 25 mg by  mouth at bedtime as needed.   Nutritional Supplements (JUICE PLUS FIBRE PO), Take by mouth daily.   Omega-3 Fatty Acids (OMEGA 3 500 PO), Take by mouth.   pyridoxine (B-6) 100 MG tablet, Take 100 mg by mouth daily.   Sodium Hyaluronate, Viscosup, (GELSYN-3) 16.8 MG/2ML SOSY, Inject 16.8 mg into the articular space once a week.   valACYclovir (VALTREX) 1000 MG tablet,    VITAMIN D PO, Take by mouth.   Reviewed prior external information including notes and imaging from  primary care provider As well as notes that were available from care everywhere and other healthcare systems.  Past medical history, social, surgical and family history all reviewed in electronic medical record.  No pertanent information unless stated regarding to the chief complaint.   Review of Systems:  No  headache, visual changes, nausea, vomiting, diarrhea, constipation, dizziness, abdominal pain, skin rash, fevers, chills, night sweats, weight loss, swollen lymph nodes, body aches, joint swelling, chest pain, shortness of breath, mood changes. POSITIVE muscle aches  Objective  There were no vitals taken for this visit.   General: No apparent distress alert and oriented x3 mood and affect normal, dressed appropriately.  HEENT: Pupils equal, extraocular movements intact  Respiratory: Patient's speak in full sentences and does not appear short of breath  Cardiovascular: No lower extremity edema, non tender, no erythema  Wrist exam shows patient does have significant swelling noted on the right wrist consistent with the ganglion cyst again on the volar aspect of the distal radius.  Procedure: Real-time Ultrasound Guided Injection of right ganglion cyst of wrist Device: GE Logiq Q7 Ultrasound guided injection is preferred based studies that show increased duration, increased effect, greater accuracy, decreased procedural pain, increased response rate, and decreased cost with ultrasound guided versus blind injection.  Verbal informed consent obtained.  Time-out conducted.  Noted no overlying erythema, induration, or other signs of local infection.  Skin prepped in a sterile fashion.  Local anesthesia: Topical Ethyl chloride.  With sterile technique and under real time ultrasound guidance: With an 18-gauge 1-1/2 inch needle patient just had needle placed into the cyst with ultrasound guidance.  Had an aspiration of jellylike material with very mild blunt change.  5 cc of jellylike material removed then needle removed and expressed another copious amounts of jellylike fluid.  No sign of any infectious etiology. Completed without difficulty  Pain immediately resolved suggesting proper intervention Advised to call if fevers/chills, erythema, induration, drainage, or persistent bleeding.  Images  saved Impression: Technically successful ultrasound guided injection.    Impression and Recommendations:     The above documentation has been reviewed and is accurate and complete [name] M Nasser Ku, DO

## 2024-07-07 NOTE — Progress Notes (Deleted)
 [facility] [address] [place] [address] Phone: [phone] Subjective:    I'm seeing this patient by the request  of:  Cynthia Lawrence, Nena, MD  CC: Right wrist pain follow-up  YEP:Dlagzrupcz  Cynthia Lawrence is a 53 y.o. female coming in with complaint of right wrist pain, has had a ganglion cyst and we did aspirate 6 weeks ago.  Patient states       Past Medical History:  Diagnosis Date   Allergy    Anxiety    Asthma    Chronic tonsillitis    Depression    Seasonal allergies    Past Surgical History:  Procedure Laterality Date   ENDOMETRIAL ABLATION     EYE SURGERY     lasik   TONSILLECTOMY Bilateral 08/15/2016   Procedure: TONSILLECTOMY;  Surgeon: Cynthia FORBES Angle, MD;  Location: [facility];  Service: ENT;  Laterality: Bilateral;   WISDOM TOOTH EXTRACTION     Social History   Socioeconomic History   Marital status: Married    Spouse name: Not on file   Number of children: Not on file   Years of education: Not on file   Highest education level: Not on file  Occupational History   Not on file  Tobacco Use   Smoking status: Never   Smokeless tobacco: Never  Vaping Use   Vaping status: Never Used  Substance and Sexual Activity   Alcohol use: Not Currently   Drug use: Yes    Types: Marijuana    Comment: occasionally   Sexual activity: Yes    Birth control/protection: Surgical    Comment: ablation  Other Topics Concern   Not on file  Social History Narrative   Left handed   Lives family in one story home,.   Social Drivers of Corporate investment banker Strain: Not on file  Food Insecurity: Not on file  Transportation Needs: Not on file  Physical Activity: Not on file  Stress: Not on file  Social Connections: Not on file   No Known Allergies Family History  Problem Relation Age of Onset   Diabetes Mother    Stroke Mother    Diabetes Father    Healthy Sister    Healthy Brother    Healthy  Daughter    Healthy Son    Healthy Sister    Healthy Daughter    Healthy Daughter       Current Outpatient Medications (Respiratory):    albuterol  (VENTOLIN  HFA) 108 (90 Base) MCG/ACT inhaler,    cetirizine (ZYRTEC) 10 MG tablet, Take 10 mg by mouth daily.   fluticasone (FLONASE) 50 MCG/ACT nasal spray, Place into both nostrils daily.   Current Outpatient Medications (Hematological):    vitamin B-12 (CYANOCOBALAMIN) 100 MCG tablet, Take 100 mcg by mouth daily.  Current Outpatient Medications (Other):    amphetamine -dextroamphetamine  (ADDERALL) 10 MG tablet, Take 1 tablet (10 mg total) by mouth 2 (two) times daily with breakfast and lunch.   buPROPion  (WELLBUTRIN  XL) 150 MG 24 hr tablet, Take 1 tablet (150 mg total) by mouth daily.   Cannabinoids (THC FREE PO), Take by mouth.   doxylamine, Sleep, (UNISOM) 25 MG tablet, Take 25 mg by mouth at bedtime as needed.   Nutritional Supplements (JUICE PLUS FIBRE PO), Take by mouth daily.   Omega-3 Fatty Acids (OMEGA 3 500 PO), Take by mouth.   pyridoxine (B-6) 100 MG tablet, Take 100 mg by mouth daily.   Sodium Hyaluronate, Viscosup, (  GELSYN-3) 16.8 MG/2ML SOSY, Inject 16.8 mg into the articular space once a week.   valACYclovir (VALTREX) 1000 MG tablet,    VITAMIN D PO, Take by mouth.   Reviewed prior external information including notes and imaging from  primary care provider As well as notes that were available from care everywhere and other healthcare systems.  Past medical history, social, surgical and family history all reviewed in electronic medical record.  No pertanent information unless stated regarding to the chief complaint.   Review of Systems:  No headache, visual changes, nausea, vomiting, diarrhea, constipation, dizziness, abdominal pain, skin rash, fevers, chills, night sweats, weight loss, swollen lymph nodes, body aches, joint swelling, chest pain, shortness of breath, mood changes. POSITIVE muscle aches  Objective   There were no vitals taken for this visit.   General: No apparent distress alert and oriented x3 mood and affect normal, dressed appropriately.  HEENT: Pupils equal, extraocular movements intact  Respiratory: Patient's speak in full sentences and does not appear short of breath  Cardiovascular: No lower extremity edema, non tender, no erythema  Right wrist exam shows  Limited muscular skeletal ultrasound was performed and interpreted by Cynthia Lawrence, M      Impression and Recommendations:    The above documentation has been reviewed and is accurate and complete Sian Joles M Nillie Bartolotta, DO

## 2024-07-08 ENCOUNTER — Ambulatory Visit: Admitting: Family Medicine

## 2024-07-09 ENCOUNTER — Encounter: Payer: Self-pay | Admitting: Family Medicine
